# Patient Record
Sex: Female | Born: 1945 | Smoking: Never smoker
Health system: Southern US, Community
[De-identification: ages and names within clinical notes are randomized; demographics above are authoritative.]

## PROBLEM LIST (undated history)

## (undated) DIAGNOSIS — N183 Chronic kidney disease, stage 3 unspecified: Secondary | ICD-10-CM

## (undated) DIAGNOSIS — I10 Essential (primary) hypertension: Secondary | ICD-10-CM

## (undated) DIAGNOSIS — J309 Allergic rhinitis, unspecified: Secondary | ICD-10-CM

## (undated) HISTORY — PX: PROSTATE SURGERY: SHX751

## (undated) HISTORY — DX: Chronic kidney disease, stage 3 unspecified: N18.30

## (undated) HISTORY — PX: CARPAL TUNNEL RELEASE: SHX101

## (undated) HISTORY — DX: Essential (primary) hypertension: I10

## (undated) HISTORY — PX: TOTAL ABDOMINAL HYSTERECTOMY W/ BILATERAL SALPINGOOPHORECTOMY: SHX83

## (undated) HISTORY — DX: Allergic rhinitis, unspecified: J30.9

---

## 2005-02-25 ENCOUNTER — Ambulatory Visit: Payer: Self-pay | Admitting: Family Medicine

## 2005-03-11 ENCOUNTER — Ambulatory Visit: Payer: Self-pay | Admitting: Family Medicine

## 2008-11-13 ENCOUNTER — Ambulatory Visit: Payer: Self-pay | Admitting: Cardiology

## 2008-11-20 ENCOUNTER — Ambulatory Visit: Payer: Self-pay | Admitting: Cardiology

## 2008-12-15 ENCOUNTER — Ambulatory Visit: Payer: Self-pay | Admitting: Cardiology

## 2011-04-19 NOTE — Assessment & Plan Note (Signed)
[name]                          [facility]ICE NOTE   NAME:[name], [name]                        MRN:          [id number]  DATE:12/15/2008                            DOB:          [birth year]    PRIMARY CARE PHYSICIAN:  Prudy Feeler, PA-C   REASON FOR VISIT:  Followup cardiac testing.   HISTORY OF PRESENT ILLNESS:  [name] [name] was seen back in December with a  history of hypertension and atypical chest pain.  She was referred for  cardiac risk stratification and underwent an exercise echocardiogram,  which did show a hypertensive response, but no reported chest pain at a  maximum workload at 7 mets.  There were no electrocardiographic changes  to suggest ischemia and no echocardiographic wall motion abnormalities  to suggest ischemia.  I reviewed these results with her today and she  was reassured.  We did adjust her medications somewhat changing her to  lisinopril/hydrochlorothiazide 20/12.5 mg p.o. q.a.m.  Followup labs  showed a BUN and creatinine 12 and 1.9 with a potassium of 3.9.  Her  blood pressure based on home checks has been much better controlled.   ALLERGIES:  AUGMENTIN.   PRESENT MEDICATIONS:  1. Omeprazole 20 mg p.o. daily.  2. Lisinopril/hydrochlorothiazide 20/12.5 mg p.o. q.a.m.   REVIEW OF SYSTEMS:  As described in the history of present illness.  Otherwise, negative.   PHYSICAL EXAMINATION:  VITAL SIGNS:  Blood pressure today is 122/80,  heart rate is 77, weight is 183 pounds.  GENERAL:  The patient is comfortable and in no acute distress.  HEENT:  Conjunctivae and lids normal.  Oropharynx is clear.  NECK:  Supple.  No elevated jugular venous pressure, no loud bruits, no  thyromegaly noted.  LUNGS:  Clear without labored breathing at rest.  CARDIAC:  Regular rate and rhythm.  No loud murmur or gallop.  EXTREMITIES:  No pitting edema.   IMPRESSION AND RECOMMENDATIONS:  1. Hypertension, much better controlled on  present dose of      lisinopril/hydrochlorothiazide.  No specific changes were made      today.  She will continue to follow up with Prudy Feeler.  2. Recent reassuring cardiac risk stratification with a normal      exercise echocardiogram showing no clear evidence of ischemia      either by electrocardiographic or echocardiographic      criteria.  Hypertension with exercise was noted.  This will warrant      continued antihypertensive therapy.  No further cardiac testing is      anticipated at this time.     Jonelle Sidle, MD  Electronically Signed    SGM/MedQ  DD: 12/15/2008  DT: 12/16/2008  Job #: [id number]   cc:   Prudy Feeler, PA-C

## 2011-04-22 NOTE — Assessment & Plan Note (Signed)
[facility]                          [facility]ICE NOTE   NAME:[name], [name]                        MRN:          [id number]  DATE:11/23/2008                            DOB:          1946/04/23    REFERRING [name] [name]:  [name] Feeler, PA-C   REASON FOR REFERRAL:  Chest pain.   HISTORY OF PRESENT ILLNESS:  [name] [name] is a 65 year old female patient  with history of newly diagnosed hypertension who is referred to Dr.  Diona [name] for further evaluation of chest pain.  She denies any history  of coronary artery disease, diabetes mellitus, dyslipidemia, or  congestive heart failure.  She, over the last 2-3 months, has noticed  sharp substernal chest pain that can occur at any time.  She notes it at  rest as well as with exertion.  She denies any associated shortness of  breath.  She denies nausea, diaphoresis, syncope, or near syncope.  She  denies any arm or jaw pain.  She denies orthopnea, PND, or pedal edema.  She denies palpitations.  She denies syncope.  She was placed on  lisinopril as well as omeprazole recently by Ms. [name] [name].  She brings in  her blood pressures with her today and also her machine.  Her machine  actually got about 20 points higher than our blood pressure cuff.  Most  of her pressures are in the 140s-150s/90s.  Her symptoms of chest  discomfort have reoccurred on two occasions since starting the  omeprazole.  Laboratory data obtained by [name] [name] in early November  2009 demonstrate a creatinine of 0.9, total cholesterol 200,  triglycerides 127, HDL 77, LDL 98, TSH 2.96, and a hemoglobin of 13.9.   PAST MEDICAL HISTORY:  As outlined above in the history of present  illness.  She also has a history of right carpal tunnel release, as well  as total abdominal hysterectomy, and bilateral salpingo-oophorectomy.  She has had a history of bright red blood per rectum and had a  colonoscopy per her report about 2 years ago with Dr.  Gabriel [name].  She also  has problems with allergic rhinitis.   CURRENT MEDICATIONS:  1. Omeprazole 20 mg daily.  2. Lisinopril 20 mg daily.  3. She takes diclofenac 75 mg b.i.d. p.r.n.  4. Erycin p.r.n.   ALLERGIES:  AUGMENTIN causes facial swelling.   SOCIAL HISTORY:  She is divorced, has 3 children.  She is retired from  [employer] work.  She denies any history of tobacco or alcohol  abuse.   FAMILY HISTORY:  Insignificant for premature CAD.  Her mother died at  age 79 and did suffer from congestive heart failure.  She also had a  pacemaker and a history of COPD.   REVIEW OF SYSTEMS:  Please see HPI.  She denies fevers, chills, cough,  melena, hematuria, or dysuria.  She has had a history of bright red  blood per rectum as noted above.  This is particularly worse with  constipation.  She does have what sounds like significant dysphagia.  This is usually with solid foods.  She denies  significant belching or  water brash symptoms.  Rest of the review of systems are negative.   PHYSICAL EXAMINATION:  GENERAL:  She is a well-nourished, well-developed  female in no acute distress.  VITAL SIGNS:  Blood pressure is 153/107 on the left and 157/103 on the  right, pulse is 88, and weight is 182 pounds.  HEENT:  Normal.  NECK:  Without JVD.  LYMPH:  Without lymphadenopathy.  ENDOCRINE:  Without thyromegaly.  CARDIAC:  Normal S1 and S2.  Regular rate and rhythm without murmur.  LUNGS:  Clear to auscultation bilaterally without wheezing, rhonchi, or  rales.  ABDOMEN:  Soft and nontender with normoactive bowel sounds.  No  organomegaly.  EXTREMITIES:  Without clubbing, cyanosis, or edema.  MUSCULOSKELETAL:  No joint deformity.  NEUROLOGIC:  She is alert and oriented x3.  Cranial nerves II-XII are  grossly intact.  FASCIA:  No carotid bruits noted bilaterally.  No femoral artery bruits  noted bilaterally.   Electrocardiogram reveals sinus rhythm with heart rate of 83, normal   axis, nonspecific ST-T wave changes, and no ischemic changes.   ASSESSMENT AND PLAN:  [name] [name] is a 65 year old female with newly  diagnosed hypertension and recent history of chest pain.  Her chest pain  is atypical.  She has very few cardiac risk factors.  She also has a  history of dysphagia and has recently been placed on omeprazole.  She  was also interviewed and examined by Dr. Diona [name] today.  We have  recommended proceeding with a stress echocardiogram to rule out the  possibility of ischemia contributing to her symptoms.  She needs better  blood pressure control and we have asked her to change her lisinopril to  lisinopril/hydrochlorothiazide 20/12.5 mg daily.  She will have a BMET  in follow up in 1 week after changing her medication.  She has been  asked to follow up with Ms. [name] [name] to further discuss her dysphagia.  Some of her symptoms of dysphagia sound quite significant and she likely  requires further workup.  She will be brought back in followup with Dr.  Diona [name] in the next month to discuss the findings on her stress  echocardiogram.     [name] Newcomer, PA-C  Electronically Signed      [name] Sidle, MD  Electronically Signed   SW/MedQ  DD: 11/13/2008  DT: 11/14/2008  Job #: [id number]

## 2012-09-25 ENCOUNTER — Encounter: Payer: Self-pay | Admitting: *Deleted

## 2012-09-27 ENCOUNTER — Other Ambulatory Visit: Payer: Self-pay | Admitting: Cardiovascular Disease

## 2012-09-27 ENCOUNTER — Encounter: Payer: Self-pay | Admitting: *Deleted

## 2012-09-27 ENCOUNTER — Encounter: Payer: Self-pay | Admitting: Cardiovascular Disease

## 2012-09-27 ENCOUNTER — Telehealth: Payer: Self-pay

## 2012-09-27 ENCOUNTER — Ambulatory Visit (INDEPENDENT_AMBULATORY_CARE_PROVIDER_SITE_OTHER): Payer: Medicare Other | Admitting: Cardiovascular Disease

## 2012-09-27 VITALS — BP 140/85 | HR 81 | Ht 63.0 in | Wt 181.8 lb

## 2012-09-27 DIAGNOSIS — Z136 Encounter for screening for cardiovascular disorders: Secondary | ICD-10-CM

## 2012-09-27 DIAGNOSIS — I10 Essential (primary) hypertension: Secondary | ICD-10-CM

## 2012-09-27 DIAGNOSIS — R079 Chest pain, unspecified: Secondary | ICD-10-CM

## 2012-09-27 NOTE — Telephone Encounter (Signed)
stress echocardiogram  Monday, October 01, 2012 [facility]

## 2012-09-27 NOTE — Patient Instructions (Addendum)
Your physician has requested that you have a stress echocardiogram. For further information please visit [email or web address]. Please follow instruction sheet as given.  Follow up as needed.  

## 2012-09-28 ENCOUNTER — Encounter: Payer: Self-pay | Admitting: Cardiovascular Disease

## 2012-09-28 DIAGNOSIS — R079 Chest pain, unspecified: Secondary | ICD-10-CM | POA: Insufficient documentation

## 2012-09-28 DIAGNOSIS — I10 Essential (primary) hypertension: Secondary | ICD-10-CM | POA: Insufficient documentation

## 2012-09-28 NOTE — Telephone Encounter (Signed)
[employer] ZOXW#[id number] EXP 10-27-12

## 2012-09-28 NOTE — Assessment & Plan Note (Signed)
The patient's chest pain is overall atypical and she has prolonged history of hypertension and other risk factors. ECG is slightly abnormal with or R wave progression in the anterior leads. I recommend further ischemic evaluation with a treadmill echocardiogram. I discussed with her the importance of lifestyle changes controlling risk factors.

## 2012-09-28 NOTE — Progress Notes (Signed)
HPI  This is a 66 year old pleasant female who is here today for evaluation of chest pain. She was seen in January of 2010 by Dr. Diona Browner for atypical chest pain. She had a stress echocardiogram at that time which was normal. She has known history of hypertension but no previous cardiac history. She has no history of diabetes, hyperlipidemia or tobacco use. There is no family history of premature coronary artery disease. Her mother died from congestive heart failure but she was 66 years old. The patient has been having left-sided sharp chest discomfort radiating to her left arm mostly at rest. There is no tightness feeling. She is very active and able to do all her yard work. One time she had exertional discomfort but was brief. Few weeks ago while she was at a funeral, she had dizziness that lasted for about 5 minutes with no palpitations or syncope.  Allergies  Allergen Reactions  . Augmentin (Amoxicillin-Pot Clavulanate) Other (See Comments)    Facial swelling     Current Outpatient Prescriptions on File Prior to Visit  Medication Sig Dispense Refill  . losartan-hydrochlorothiazide (HYZAAR) 50-12.5 MG per tablet Take 1 tablet by mouth daily.       Marland Kitchen omeprazole (PRILOSEC) 20 MG capsule Take 20 mg by mouth daily.         Past Medical History  Diagnosis Date  . Allergic rhinitis   . HTN (hypertension)      Past Surgical History  Procedure Date  . Carpal tunnel release     RIGHT  . Total abdominal hysterectomy w/ bilateral salpingoophorectomy      Family History  Problem Relation Age of Onset  . Heart failure Mother     had pacemaker  . COPD Mother      History   Social History  . Marital Status: Married    Spouse Name: N/A    Number of Children: N/A  . Years of Education: N/A   Occupational History  .      retired from [employer] work   Social History Main Topics  . Smoking status: Never Smoker   . Smokeless tobacco: Never Used  . Alcohol Use: No    . Drug Use: No  . Sexually Active: Not on file   Other Topics Concern  . Not on file   Social History Narrative   Has 3 children     ROS Constitutional: Negative for fever, chills, diaphoresis, activity change, appetite change and fatigue.  HENT: Negative for hearing loss, nosebleeds, congestion, sore throat, facial swelling, drooling, trouble swallowing, neck pain, voice change, sinus pressure and tinnitus.  Eyes: Negative for photophobia, pain, discharge and visual disturbance.  Respiratory: Negative for apnea, cough  and wheezing.  Cardiovascular: Negative for chest pain, palpitations and leg swelling.  Gastrointestinal: Negative for nausea, vomiting, abdominal pain, diarrhea, constipation, blood in stool and abdominal distention.  Genitourinary: Negative for dysuria, urgency, frequency, hematuria and decreased urine volume.  Musculoskeletal: Negative for myalgias, back pain, joint swelling, arthralgias and gait problem.  Skin: Negative for color change, pallor, rash and wound.  Neurological: Negative for dizziness, tremors, seizures, syncope, speech difficulty, weakness, light-headedness, numbness and headaches.  Psychiatric/Behavioral: Negative for suicidal ideas, hallucinations, behavioral problems and agitation. The patient is not nervous/anxious.     PHYSICAL EXAM   BP 140/85  Pulse 81  Ht 5\' 3"  (1.6 m)  Wt 181 lb 12.8 oz (82.464 kg)  BMI 32.20 kg/m2 Constitutional: She is oriented to person, place, and time.  She appears well-developed and well-nourished. No distress.  HENT: No nasal discharge.  Head: Normocephalic and atraumatic.  Eyes: Pupils are equal and round. Right eye exhibits no discharge. Left eye exhibits no discharge.  Neck: Normal range of motion. Neck supple. No JVD present. No thyromegaly present.  Cardiovascular: Normal rate, regular rhythm, normal heart sounds. Exam reveals no gallop and no friction rub. No murmur heard.  Pulmonary/Chest: Effort  normal and breath sounds normal. No stridor. No respiratory distress. She has no wheezes. She has no rales. She exhibits no tenderness.  Abdominal: Soft. Bowel sounds are normal. She exhibits no distension. There is no tenderness. There is no rebound and no guarding.  Musculoskeletal: Normal range of motion. She exhibits no edema and no tenderness.  Neurological: She is alert and oriented to person, place, and time. Coordination normal.  Skin: Skin is warm and dry. No rash noted. She is not diaphoretic. No erythema. No pallor.  Psychiatric: She has a normal mood and affect. Her behavior is normal. Judgment and thought content normal.     EKG: Normal sinus rhythm with poor R-wave progression anterior leads. No significant ST or T wave changes.    ASSESSMENT AND PLAN

## 2012-09-28 NOTE — Assessment & Plan Note (Signed)
Her blood pressure is slightly elevated today. This will be evaluated during the stress test as well and consider increasing the dose of Hyzaar if needed

## 2012-10-01 DIAGNOSIS — R079 Chest pain, unspecified: Secondary | ICD-10-CM

## 2012-10-05 ENCOUNTER — Encounter: Payer: Self-pay | Admitting: *Deleted

## 2012-10-30 ENCOUNTER — Ambulatory Visit: Payer: Self-pay | Admitting: Cardiology

## 2015-11-19 ENCOUNTER — Encounter: Payer: Self-pay | Admitting: Physician Assistant

## 2016-01-19 DIAGNOSIS — N644 Mastodynia: Secondary | ICD-10-CM | POA: Diagnosis not present

## 2016-03-28 ENCOUNTER — Encounter: Payer: Self-pay | Admitting: Physician Assistant

## 2016-03-28 DIAGNOSIS — R0789 Other chest pain: Secondary | ICD-10-CM | POA: Diagnosis not present

## 2016-03-31 ENCOUNTER — Encounter: Payer: Self-pay | Admitting: Physician Assistant

## 2016-03-31 DIAGNOSIS — Z01812 Encounter for preprocedural laboratory examination: Secondary | ICD-10-CM | POA: Diagnosis not present

## 2016-08-04 DIAGNOSIS — M940 Chondrocostal junction syndrome [Tietze]: Secondary | ICD-10-CM | POA: Diagnosis not present

## 2016-12-06 DIAGNOSIS — Z1231 Encounter for screening mammogram for malignant neoplasm of breast: Secondary | ICD-10-CM | POA: Diagnosis not present

## 2017-01-02 ENCOUNTER — Telehealth: Payer: Self-pay | Admitting: Physician Assistant

## 2017-01-02 DIAGNOSIS — M2550 Pain in unspecified joint: Secondary | ICD-10-CM

## 2017-01-02 MED ORDER — DICLOFENAC SODIUM 75 MG PO TBEC: 75.0000 mg | DELAYED_RELEASE_TABLET | Freq: Two times a day (BID) | ORAL | 2 refills | Status: DC

## 2017-01-02 NOTE — Telephone Encounter (Signed)
Patient aware that rx sent to pharmacy. 

## 2017-01-02 NOTE — Telephone Encounter (Signed)
Prescription sent to pharmacy.

## 2017-08-11 ENCOUNTER — Encounter: Payer: Self-pay | Admitting: Physician Assistant

## 2017-08-11 ENCOUNTER — Ambulatory Visit (INDEPENDENT_AMBULATORY_CARE_PROVIDER_SITE_OTHER): Payer: Medicare Other | Admitting: Physician Assistant

## 2017-08-11 VITALS — BP 133/87 | HR 77 | Temp 97.9°F | Ht 63.0 in | Wt 183.0 lb

## 2017-08-11 DIAGNOSIS — I10 Essential (primary) hypertension: Secondary | ICD-10-CM | POA: Diagnosis not present

## 2017-08-11 DIAGNOSIS — R51 Headache: Secondary | ICD-10-CM

## 2017-08-11 DIAGNOSIS — R21 Rash and other nonspecific skin eruption: Secondary | ICD-10-CM | POA: Diagnosis not present

## 2017-08-11 DIAGNOSIS — R5383 Other fatigue: Secondary | ICD-10-CM | POA: Diagnosis not present

## 2017-08-11 DIAGNOSIS — M159 Polyosteoarthritis, unspecified: Secondary | ICD-10-CM | POA: Diagnosis not present

## 2017-08-11 DIAGNOSIS — R519 Headache, unspecified: Secondary | ICD-10-CM

## 2017-08-11 DIAGNOSIS — M2550 Pain in unspecified joint: Secondary | ICD-10-CM

## 2017-08-11 DIAGNOSIS — W57XXXS Bitten or stung by nonvenomous insect and other nonvenomous arthropods, sequela: Secondary | ICD-10-CM

## 2017-08-11 MED ORDER — PANTOPRAZOLE SODIUM 40 MG PO TBEC: 40.0000 mg | DELAYED_RELEASE_TABLET | Freq: Every day | ORAL | 3 refills | Status: DC

## 2017-08-11 MED ORDER — DICLOFENAC SODIUM 75 MG PO TBEC: 75.0000 mg | DELAYED_RELEASE_TABLET | Freq: Two times a day (BID) | ORAL | 3 refills | Status: DC

## 2017-08-11 NOTE — Patient Instructions (Signed)
Lyme Disease Lyme disease is an infection that affects many parts of the body, including the skin, joints, and nervous system. It is a bacterial infection that starts from the bite of an infected tick. The infection can spread, and some of the symptoms are similar to the flu. If Lyme disease is not treated, it may cause joint pain, swelling, numbness, problems thinking, fatigue, muscle weakness, and other problems. What are the causes? This condition is caused by bacteria called Borrelia burgdorferi. You can get Lyme disease by being bitten by an infected tick. The tick must be attached to your skin to pass along the infection. Deer often carry infected ticks. What increases the risk? The following factors may make you more likely to develop this condition:  Living in or visiting these areas in the [place]  [place].  The [place] states.  The [place].  Spending time in wooded or grassy areas.  Being outdoors with exposed skin.  Camping, gardening, hiking, fishing, or hunting outdoors.  Failing to remove a tick from your skin within 3-4 days. What are the signs or symptoms? Symptoms of this condition include:  A round, red rash that surrounds the center of the tick bite. This is the first sign of infection. The center of the rash may be blood colored or have tiny blisters.  Fatigue.  Headache.  Chills and fever.  General achiness.  Joint pain, often in the knees.  Muscle pain.  Swollen lymph glands.  Stiff neck. How is this diagnosed? This condition is diagnosed based on:  Your symptoms and medical history.  A physical exam.  A blood test. How is this treated? The main treatment for this condition is antibiotic medicine, which is usually taken by mouth (orally). The length of treatment depends on how soon after a tick bite you begin taking the medicine. In some cases, treatment is necessary for several weeks. If the infection is severe, antibiotics may  need to be given through an IV tube that is inserted into one of your veins. Follow these instructions at home:  Take your antibiotic medicine as told by your health care provider. Do not stop taking the antibiotic even if you start to feel better.  Ask your health care provider about takinga probiotic in between doses of your antibiotic to help avoid stomach upset or diarrhea.  Check with your health care provider before supplementing your treatment. Many alternative therapies have not been proven and may be harmful to you.  Keep all follow-up visits as told by your health care provider. This is important. How is this prevented? You can become reinfected if you get another tick bite from an infected tick. Take these steps to help prevent an infection:  Cover your skin with light-colored clothing when you are outdoors in the spring and summer months.  Spray clothing and skin with bug spray. The spray should be 20-30% DEET.  Avoid wooded, grassy, and shaded areas.  Remove yard litter, brush, trash, and plants that attract deer and rodents.  Check yourself for ticks when you come indoors.  Wash clothing worn each day.  Check your pets for ticks before they come inside.  If you find a tick:  Remove it with tweezers.  Clean your hands and the bite area with rubbing alcohol or soap and water. Pregnant women should take special care to avoid tick bites because the infection can be passed along to the fetus. Contact a health care provider if:  You have symptoms after   treatment.  You have removed a tick and want to bring it to your health care provider for testing. Get help right away if:  You have an irregular heartbeat.  You have nerve pain.  Your face feels numb. This information is not intended to replace advice given to you by your health care provider. Make sure you discuss any questions you have with your health care provider. Document Released: 02/27/2001 Document  Revised: 07/12/2016 Document Reviewed: 07/12/2016 Elsevier Interactive Patient Education  2017 [employer]  

## 2017-08-14 LAB — CBC WITH DIFFERENTIAL/PLATELET
BASOS ABS: 0 10*3/uL (ref 0.0–0.2)
Basos: 0 %
EOS (ABSOLUTE): 0.1 10*3/uL (ref 0.0–0.4)
Eos: 1 %
Hematocrit: 40.3 % (ref 34.0–46.6)
Hemoglobin: 12.7 g/dL (ref 11.1–15.9)
Immature Grans (Abs): 0 10*3/uL (ref 0.0–0.1)
Immature Granulocytes: 0 %
LYMPHS ABS: 2.4 10*3/uL (ref 0.7–3.1)
Lymphs: 25 %
MCH: 26.3 pg — ABNORMAL LOW (ref 26.6–33.0)
MCHC: 31.5 g/dL (ref 31.5–35.7)
MCV: 83 fL (ref 79–97)
MONOCYTES: 8 %
MONOS ABS: 0.7 10*3/uL (ref 0.1–0.9)
Neutrophils Absolute: 6.2 10*3/uL (ref 1.4–7.0)
Neutrophils: 66 %
PLATELETS: 375 10*3/uL (ref 150–379)
RBC: 4.83 x10E6/uL (ref 3.77–5.28)
RDW: 14.2 % (ref 12.3–15.4)
WBC: 9.4 10*3/uL (ref 3.4–10.8)

## 2017-08-14 LAB — LIPID PANEL
Chol/HDL Ratio: 2.7 ratio (ref 0.0–4.4)
Cholesterol, Total: 194 mg/dL (ref 100–199)
HDL: 73 mg/dL (ref >39)
LDL Calculated: 100 mg/dL — ABNORMAL HIGH (ref 0–99)
TRIGLYCERIDES: 105 mg/dL (ref 0–149)
VLDL Cholesterol Cal: 21 mg/dL (ref 5–40)

## 2017-08-14 LAB — CMP14+EGFR
ALK PHOS: 88 IU/L (ref 39–117)
ALT: 17 IU/L (ref 0–32)
AST: 19 IU/L (ref 0–40)
Albumin/Globulin Ratio: 1.4 (ref 1.2–2.2)
Albumin: 4.4 g/dL (ref 3.5–4.8)
BUN/Creatinine Ratio: 19 (ref 12–28)
BUN: 20 mg/dL (ref 8–27)
Bilirubin Total: 0.3 mg/dL (ref 0.0–1.2)
CO2: 26 mmol/L (ref 20–29)
CREATININE: 1.06 mg/dL — AB (ref 0.57–1.00)
Calcium: 10.3 mg/dL (ref 8.7–10.3)
Chloride: 99 mmol/L (ref 96–106)
GFR calc Af Amer: 61 mL/min/{1.73_m2} (ref >59)
GFR calc non Af Amer: 53 mL/min/{1.73_m2} — ABNORMAL LOW (ref >59)
GLUCOSE: 96 mg/dL (ref 65–99)
Globulin, Total: 3.1 g/dL (ref 1.5–4.5)
Potassium: 4.3 mmol/L (ref 3.5–5.2)
Sodium: 142 mmol/L (ref 134–144)
Total Protein: 7.5 g/dL (ref 6.0–8.5)

## 2017-08-14 LAB — LYME AB/WESTERN BLOT REFLEX

## 2017-08-14 LAB — THYROID PANEL WITH TSH
FREE THYROXINE INDEX: 2.3 (ref 1.2–4.9)
T3 UPTAKE RATIO: 25 % (ref 24–39)
T4, Total: 9.1 ug/dL (ref 4.5–12.0)
TSH: 4 u[IU]/mL (ref 0.450–4.500)

## 2017-08-14 NOTE — Progress Notes (Signed)
BP 133/87   Pulse 77   Temp 97.9 F (36.6 C) (Oral)   Ht 5' 3" (1.6 m)   Wt 183 lb (83 kg)   BMI 32.42 kg/m    Subjective:    Patient ID: [name] [name], female    DOB: 11/22/1946, 71 y.o.   MRN: [id number]  HPI: [name] [name] is a 71 y.o. female presenting on 08/11/2017 for Hypertension (medication refill, labwork); Gastroesophageal Reflux; and New Patient (Initial Visit)  She has been well overall until mid summer. She had known exposure to deer tick bites. Reports many symptoms of feeling feverish, tired, myalgias in large muscles, later joint pain.  She is concerned about tick borne disease.  Her GERD has been stable, along with OA and HTN.  Relevant past medical, surgical, family and social history reviewed and updated as indicated. Allergies and medications reviewed and updated.  Past Medical History:  Diagnosis Date  . Allergic rhinitis   . HTN (hypertension)     Past Surgical History:  Procedure Laterality Date  . CARPAL TUNNEL RELEASE     RIGHT  . TOTAL ABDOMINAL HYSTERECTOMY W/ BILATERAL SALPINGOOPHORECTOMY      Review of Systems  Constitutional: Positive for fatigue. Negative for activity change and fever.  HENT: Negative.   Eyes: Negative.   Respiratory: Negative.  Negative for cough.   Cardiovascular: Negative.  Negative for chest pain.  Gastrointestinal: Negative.  Negative for abdominal pain.  Endocrine: Negative.   Genitourinary: Negative.  Negative for dysuria.  Musculoskeletal: Positive for arthralgias and myalgias.  Skin: Negative.   Neurological: Positive for headaches. Negative for numbness.    Allergies as of 08/11/2017      Reactions   Augmentin [amoxicillin-pot Clavulanate] Other (See Comments)   Facial swelling      Medication List       Accurate as of 08/11/17 11:59 PM. Always use your most recent med list.          diclofenac 75 MG EC tablet Commonly known as:  VOLTAREN Take 1 tablet (75 mg total) by mouth 2 (two) times daily.     losartan-hydrochlorothiazide 50-12.5 MG tablet Commonly known as:  HYZAAR Take 1 tablet by mouth daily.   pantoprazole 40 MG tablet Commonly known as:  PROTONIX Take 1 tablet (40 mg total) by mouth daily.            Discharge Care Instructions        Start     Ordered   08/11/17 0000  diclofenac (VOLTAREN) 75 MG EC tablet  2 times daily     08/11/17 1124   08/11/17 0000  pantoprazole (PROTONIX) 40 MG tablet  Daily     08/11/17 1124   08/11/17 0000  CBC with Differential/Platelet     08/11/17 1124   08/11/17 0000  CMP14+EGFR     08/11/17 1124   08/11/17 0000  Lyme Ab/Western Blot Reflex     08/11/17 1124   08/11/17 0000  Lipid panel     08/11/17 1124   08/11/17 0000  Thyroid Panel With TSH     08/11/17 1124         Objective:    BP 133/87   Pulse 77   Temp 97.9 F (36.6 C) (Oral)   Ht 5' 3" (1.6 m)   Wt 183 lb (83 kg)   BMI 32.42 kg/m   Allergies  Allergen Reactions  . Augmentin [Amoxicillin-Pot Clavulanate] Other (See Comments)    Facial swelling  Physical Exam  Constitutional: She is oriented to person, place, and time. She appears well-developed and well-nourished.  HENT:  Head: Normocephalic and atraumatic.  Right Ear: Tympanic membrane, external ear and ear canal normal.  Left Ear: Tympanic membrane, external ear and ear canal normal.  Nose: Nose normal. No rhinorrhea.  Mouth/Throat: Oropharynx is clear and moist and mucous membranes are normal. No oropharyngeal exudate or posterior oropharyngeal erythema.  Eyes: Pupils are equal, round, and reactive to light. Conjunctivae and EOM are normal.  Neck: Normal range of motion. Neck supple.  Cardiovascular: Normal rate, regular rhythm, normal heart sounds and intact distal pulses.   Pulmonary/Chest: Effort normal and breath sounds normal.  Abdominal: Soft. Bowel sounds are normal.  Neurological: She is alert and oriented to person, place, and time. She has normal reflexes.  Skin: Skin is warm and  dry. No rash noted.  Psychiatric: She has a normal mood and affect. Her behavior is normal. Judgment and thought content normal.    Results for orders placed or performed in visit on 08/11/17  CBC with Differential/Platelet  Result Value Ref Range   WBC 9.4 3.4 - 10.8 x10E3/uL   RBC 4.83 3.77 - 5.28 x10E6/uL   Hemoglobin 12.7 11.1 - 15.9 g/dL   Hematocrit 40.3 34.0 - 46.6 %   MCV 83 79 - 97 fL   MCH 26.3 (L) 26.6 - 33.0 pg   MCHC 31.5 31.5 - 35.7 g/dL   RDW 14.2 12.3 - 15.4 %   Platelets 375 150 - 379 x10E3/uL   Neutrophils 66 Not Estab. %   Lymphs 25 Not Estab. %   Monocytes 8 Not Estab. %   Eos 1 Not Estab. %   Basos 0 Not Estab. %   Neutrophils Absolute 6.2 1.4 - 7.0 x10E3/uL   Lymphocytes Absolute 2.4 0.7 - 3.1 x10E3/uL   Monocytes Absolute 0.7 0.1 - 0.9 x10E3/uL   EOS (ABSOLUTE) 0.1 0.0 - 0.4 x10E3/uL   Basophils Absolute 0.0 0.0 - 0.2 x10E3/uL   Immature Granulocytes 0 Not Estab. %   Immature Grans (Abs) 0.0 0.0 - 0.1 x10E3/uL  CMP14+EGFR  Result Value Ref Range   Glucose 96 65 - 99 mg/dL   BUN 20 8 - 27 mg/dL   Creatinine, Ser 1.06 (H) 0.57 - 1.00 mg/dL   GFR calc non Af Amer 53 (L) >59 mL/min/1.73   GFR calc Af Amer 61 >59 mL/min/1.73   BUN/Creatinine Ratio 19 12 - 28   Sodium 142 134 - 144 mmol/L   Potassium 4.3 3.5 - 5.2 mmol/L   Chloride 99 96 - 106 mmol/L   CO2 26 20 - 29 mmol/L   Calcium 10.3 8.7 - 10.3 mg/dL   Total Protein 7.5 6.0 - 8.5 g/dL   Albumin 4.4 3.5 - 4.8 g/dL   Globulin, Total 3.1 1.5 - 4.5 g/dL   Albumin/Globulin Ratio 1.4 1.2 - 2.2   Bilirubin Total 0.3 0.0 - 1.2 mg/dL   Alkaline Phosphatase 88 39 - 117 IU/L   AST 19 0 - 40 IU/L   ALT 17 0 - 32 IU/L  Lyme Ab/Western Blot Reflex  Result Value Ref Range   Lyme IgG/IgM Ab <0.91 0.00 - 0.90 ISR   LYME DISEASE AB, QUANT, IGM <0.80 0.00 - 0.79 index  Lipid panel  Result Value Ref Range   Cholesterol, Total 194 100 - 199 mg/dL   Triglycerides 105 0 - 149 mg/dL   HDL 73 >39 mg/dL   VLDL  Cholesterol Cal 21  5 - 40 mg/dL   LDL Calculated 100 (H) 0 - 99 mg/dL   Chol/HDL Ratio 2.7 0.0 - 4.4 ratio  Thyroid Panel With TSH  Result Value Ref Range   TSH 4.000 0.450 - 4.500 uIU/mL   T4, Total 9.1 4.5 - 12.0 ug/dL   T3 Uptake Ratio 25 24 - 39 %   Free Thyroxine Index 2.3 1.2 - 4.9      Assessment & Plan:   1. Arthralgia, unspecified joint - diclofenac (VOLTAREN) 75 MG EC tablet; Take 1 tablet (75 mg total) by mouth 2 (two) times daily.  Dispense: 180 tablet; Refill: 3 - Lyme Ab/Western Blot Reflex  2. Tick bite, sequela - Lyme Ab/Western Blot Reflex  3. Chronic nonintractable headache, unspecified headache type - CBC with Differential/Platelet - Lyme Ab/Western Blot Reflex  4. Rash - Lyme Ab/Western Blot Reflex  5. Essential hypertension - CBC with Differential/Platelet - CMP14+EGFR - Lipid panel  6. Generalized OA  7. Fatigue, unspecified type - CBC with Differential/Platelet - Lyme Ab/Western Blot Reflex - Thyroid Panel With TSH    Current Outpatient Prescriptions:  .  losartan-hydrochlorothiazide (HYZAAR) 50-12.5 MG per tablet, Take 1 tablet by mouth daily. , Disp: , Rfl:  .  pantoprazole (PROTONIX) 40 MG tablet, Take 1 tablet (40 mg total) by mouth daily., Disp: 90 tablet, Rfl: 3 .  diclofenac (VOLTAREN) 75 MG EC tablet, Take 1 tablet (75 mg total) by mouth 2 (two) times daily., Disp: 180 tablet, Rfl: 3 Continue all other maintenance medications as listed above.  Follow up plan: Return in about 4 weeks (around 09/08/2017) for recheck.  Educational handout given for [place] PA-C [place] [address]  [place], [place] [address] [phone]   08/14/2017, 10:16 PM

## 2017-08-15 ENCOUNTER — Telehealth: Payer: Self-pay | Admitting: Physician Assistant

## 2017-08-15 MED ORDER — DOXYCYCLINE HYCLATE 100 MG PO TABS: 100.0000 mg | ORAL_TABLET | Freq: Two times a day (BID) | ORAL | 0 refills | Status: DC

## 2017-08-15 NOTE — Telephone Encounter (Signed)
Patient aware that antibiotic has been sent to pharmacy

## 2017-09-06 ENCOUNTER — Telehealth: Payer: Self-pay | Admitting: Physician Assistant

## 2017-09-06 MED ORDER — DOXYCYCLINE HYCLATE 100 MG PO TABS: 100.0000 mg | ORAL_TABLET | Freq: Two times a day (BID) | ORAL | 0 refills | Status: DC

## 2017-09-06 NOTE — Telephone Encounter (Signed)
What symptoms do you have? Blister near tick bite  How long have you been sick? Tick bite happened last week  Have you been seen for this problem? Yes in the past was taking antibiotic will need a refill since she got bit again  If your provider decides to give you a prescription, which pharmacy would you like for it to be sent to? [facility]   Patient informed that this information will be sent to the clinical staff for review and that they should receive a follow up call.

## 2017-09-06 NOTE — Telephone Encounter (Signed)
Patient aware.

## 2017-09-06 NOTE — Telephone Encounter (Signed)
Please advise if refill will be sent to pharmacy.

## 2017-09-11 ENCOUNTER — Ambulatory Visit: Payer: Medicare Other | Admitting: Physician Assistant

## 2017-09-15 ENCOUNTER — Ambulatory Visit (INDEPENDENT_AMBULATORY_CARE_PROVIDER_SITE_OTHER): Payer: Medicare Other | Admitting: Physician Assistant

## 2017-09-15 ENCOUNTER — Encounter: Payer: Self-pay | Admitting: Physician Assistant

## 2017-09-15 VITALS — BP 130/85 | HR 81 | Temp 97.6°F | Ht 63.0 in | Wt 183.4 lb

## 2017-09-15 DIAGNOSIS — T7849XA Other allergy, initial encounter: Secondary | ICD-10-CM | POA: Diagnosis not present

## 2017-09-15 DIAGNOSIS — T7840XA Allergy, unspecified, initial encounter: Secondary | ICD-10-CM

## 2017-09-15 MED ORDER — LORATADINE 10 MG PO TABS: 10.0000 mg | ORAL_TABLET | Freq: Every day | ORAL | 0 refills | Status: AC

## 2017-09-15 MED ORDER — PREDNISONE 10 MG (21) PO TBPK: ORAL_TABLET | ORAL | 0 refills | Status: DC

## 2017-09-15 NOTE — Progress Notes (Signed)
BP 130/85   Pulse 81   Temp 97.6 F (36.4 C) (Oral)   Ht 5\' 3"  (1.6 m)   Wt 183 lb 6.4 oz (83.2 kg)   BMI 32.49 kg/m    Subjective:    Patient ID: [name] [name], female    DOB: 1945/12/09, 71 y.o.   MRN: [id number]  HPI: Chellie Vanlue is a 71 y.o. female presenting on 09/15/2017 for Insect Bite (right lower leg)  The patient came in for a new insect bite that has not resolved. It had had some infection. But the pus is now gone and she had finished her antibiotic. However continues to have a swollen blistery area at the site of the bite. She denies any fever or chills.  Relevant past medical, surgical, family and social history reviewed and updated as indicated. Allergies and medications reviewed and updated.  Past Medical History:  Diagnosis Date  . Allergic rhinitis   . HTN (hypertension)     Past Surgical History:  Procedure Laterality Date  . CARPAL TUNNEL RELEASE     RIGHT  . PROSTATE SURGERY    . TOTAL ABDOMINAL HYSTERECTOMY W/ BILATERAL SALPINGOOPHORECTOMY      Review of Systems  Constitutional: Negative.  Negative for fatigue and fever.  HENT: Negative.   Eyes: Negative.   Respiratory: Negative.  Negative for shortness of breath and wheezing.   Cardiovascular: Negative for chest pain, palpitations and leg swelling.  Gastrointestinal: Negative.   Genitourinary: Negative.   Skin: Positive for color change and wound.    Allergies as of 09/15/2017      Reactions   Augmentin [amoxicillin-pot Clavulanate] Other (See Comments)   Facial swelling      Medication List       Accurate as of 09/15/17  3:47 PM. Always use your most recent med list.          diclofenac 75 MG EC tablet Commonly known as:  VOLTAREN Take 1 tablet (75 mg total) by mouth 2 (two) times daily.   doxycycline 100 MG tablet Commonly known as:  VIBRA-TABS Take 1 tablet (100 mg total) by mouth 2 (two) times daily.   loratadine 10 MG tablet Commonly known as:  CLARITIN Take 1 tablet  (10 mg total) by mouth daily.   losartan-hydrochlorothiazide 50-12.5 MG tablet Commonly known as:  HYZAAR Take 1 tablet by mouth daily.   pantoprazole 40 MG tablet Commonly known as:  PROTONIX Take 1 tablet (40 mg total) by mouth daily.   predniSONE 10 MG (21) Tbpk tablet Commonly known as:  STERAPRED UNI-PAK 21 TAB Take as directed 6 days          Objective:    BP 130/85   Pulse 81   Temp 97.6 F (36.4 C) (Oral)   Ht 5\' 3"  (1.6 m)   Wt 183 lb 6.4 oz (83.2 kg)   BMI 32.49 kg/m   Allergies  Allergen Reactions  . Augmentin [Amoxicillin-Pot Clavulanate] Other (See Comments)    Facial swelling    Physical Exam  Constitutional: She is oriented to person, place, and time. She appears well-developed and well-nourished.  HENT:  Head: Normocephalic and atraumatic.  Eyes: Pupils are equal, round, and reactive to light. Conjunctivae and EOM are normal.  Cardiovascular: Normal rate, regular rhythm, normal heart sounds and intact distal pulses.   Pulmonary/Chest: Effort normal and breath sounds normal.  Abdominal: Soft. Bowel sounds are normal.  Neurological: She is alert and oriented to person, place, and time. She has normal  reflexes.  Skin: Skin is warm and dry. Rash noted. Rash is vesicular. There is erythema.     2cm width bullous on posterior right ankle. Minimal red ring at the edge. No surrounding erythema or warmth.  Psychiatric: She has a normal mood and affect. Her behavior is normal. Judgment and thought content normal.        Assessment & Plan:   1. Allergic reaction, initial encounter - loratadine (CLARITIN) 10 MG tablet; Take 1 tablet (10 mg total) by mouth daily.  Dispense: 30 tablet; Refill: 0 - predniSONE (STERAPRED UNI-PAK 21 TAB) 10 MG (21) TBPK tablet; Take as directed 6 days  Dispense: 21 tablet; Refill: 0    Current Outpatient Prescriptions:  .  diclofenac (VOLTAREN) 75 MG EC tablet, Take 1 tablet (75 mg total) by mouth 2 (two) times daily.,  Disp: 180 tablet, Rfl: 3 .  doxycycline (VIBRA-TABS) 100 MG tablet, Take 1 tablet (100 mg total) by mouth 2 (two) times daily., Disp: 20 tablet, Rfl: 0 .  losartan-hydrochlorothiazide (HYZAAR) 50-12.5 MG per tablet, Take 1 tablet by mouth daily. , Disp: , Rfl:  .  pantoprazole (PROTONIX) 40 MG tablet, Take 1 tablet (40 mg total) by mouth daily., Disp: 90 tablet, Rfl: 3 .  loratadine (CLARITIN) 10 MG tablet, Take 1 tablet (10 mg total) by mouth daily., Disp: 30 tablet, Rfl: 0 .  predniSONE (STERAPRED UNI-PAK 21 TAB) 10 MG (21) TBPK tablet, Take as directed 6 days, Disp: 21 tablet, Rfl: 0 Continue all other maintenance medications as listed above.  Follow up plan: Return if symptoms worsen or fail to improve.  Educational handout given for [place] PA-C [place] [address]  [place], [place] [address] [phone]   09/15/2017, 3:47 PM

## 2017-09-15 NOTE — Patient Instructions (Signed)
In a few days you may receive a survey in the mail or online from [employer] regarding your visit with us today. Please take a moment to fill this out. Your feedback is very important to our whole office. It can help us better understand your needs as well as improve your experience and satisfaction. Thank you for taking your time to complete it. We care about you.  Jakki Doughty, PA-C  

## 2017-09-28 DIAGNOSIS — M722 Plantar fascial fibromatosis: Secondary | ICD-10-CM | POA: Diagnosis not present

## 2017-09-28 DIAGNOSIS — M79672 Pain in left foot: Secondary | ICD-10-CM | POA: Diagnosis not present

## 2017-10-17 DIAGNOSIS — H2513 Age-related nuclear cataract, bilateral: Secondary | ICD-10-CM | POA: Diagnosis not present

## 2017-10-19 DIAGNOSIS — M722 Plantar fascial fibromatosis: Secondary | ICD-10-CM | POA: Diagnosis not present

## 2017-10-19 DIAGNOSIS — M79672 Pain in left foot: Secondary | ICD-10-CM | POA: Diagnosis not present

## 2017-11-22 DIAGNOSIS — M722 Plantar fascial fibromatosis: Secondary | ICD-10-CM | POA: Diagnosis not present

## 2017-11-22 DIAGNOSIS — M79672 Pain in left foot: Secondary | ICD-10-CM | POA: Diagnosis not present

## 2018-02-06 ENCOUNTER — Encounter: Payer: Self-pay | Admitting: Physician Assistant

## 2018-02-07 ENCOUNTER — Other Ambulatory Visit: Payer: Self-pay | Admitting: *Deleted

## 2018-02-07 MED ORDER — LOSARTAN POTASSIUM-HCTZ 50-12.5 MG PO TABS: 1.0000 | ORAL_TABLET | Freq: Every day | ORAL | 0 refills | Status: DC

## 2018-02-07 NOTE — Telephone Encounter (Signed)
Authorize 30 days only. Then contact the patient letting them know that they will need an appointment before any further prescriptions can be sent in. 

## 2018-02-07 NOTE — Telephone Encounter (Signed)
Patient aware.

## 2018-05-23 ENCOUNTER — Ambulatory Visit (INDEPENDENT_AMBULATORY_CARE_PROVIDER_SITE_OTHER): Payer: Medicare Other

## 2018-05-23 ENCOUNTER — Ambulatory Visit (INDEPENDENT_AMBULATORY_CARE_PROVIDER_SITE_OTHER): Payer: Medicare Other | Admitting: Physician Assistant

## 2018-05-23 ENCOUNTER — Encounter: Payer: Self-pay | Admitting: Physician Assistant

## 2018-05-23 VITALS — BP 139/82 | HR 70 | Temp 97.9°F | Ht 63.0 in | Wt 186.4 lb

## 2018-05-23 DIAGNOSIS — M8580 Other specified disorders of bone density and structure, unspecified site: Secondary | ICD-10-CM | POA: Diagnosis not present

## 2018-05-23 DIAGNOSIS — M722 Plantar fascial fibromatosis: Secondary | ICD-10-CM | POA: Diagnosis not present

## 2018-05-23 DIAGNOSIS — M2550 Pain in unspecified joint: Secondary | ICD-10-CM | POA: Diagnosis not present

## 2018-05-23 DIAGNOSIS — M8588 Other specified disorders of bone density and structure, other site: Secondary | ICD-10-CM | POA: Diagnosis not present

## 2018-05-23 DIAGNOSIS — Z1211 Encounter for screening for malignant neoplasm of colon: Secondary | ICD-10-CM

## 2018-05-23 DIAGNOSIS — Z0000 Encounter for general adult medical examination without abnormal findings: Secondary | ICD-10-CM

## 2018-05-23 LAB — CMP14+EGFR
ALT: 18 IU/L (ref 0–32)
AST: 21 IU/L (ref 0–40)
Albumin/Globulin Ratio: 1.8 (ref 1.2–2.2)
Albumin: 4.5 g/dL (ref 3.5–4.8)
Alkaline Phosphatase: 65 IU/L (ref 39–117)
BUN/Creatinine Ratio: 17 (ref 12–28)
BUN: 17 mg/dL (ref 8–27)
Bilirubin Total: 0.3 mg/dL (ref 0.0–1.2)
CALCIUM: 10.2 mg/dL (ref 8.7–10.3)
CO2: 24 mmol/L (ref 20–29)
CREATININE: 0.98 mg/dL (ref 0.57–1.00)
Chloride: 104 mmol/L (ref 96–106)
GFR calc Af Amer: 67 mL/min/{1.73_m2} (ref >59)
GFR, EST NON AFRICAN AMERICAN: 58 mL/min/{1.73_m2} — AB (ref >59)
Globulin, Total: 2.5 g/dL (ref 1.5–4.5)
Glucose: 82 mg/dL (ref 65–99)
Potassium: 4.2 mmol/L (ref 3.5–5.2)
Sodium: 144 mmol/L (ref 134–144)
Total Protein: 7 g/dL (ref 6.0–8.5)

## 2018-05-23 LAB — CBC WITH DIFFERENTIAL/PLATELET
Basophils Absolute: 0 10*3/uL (ref 0.0–0.2)
Basos: 0 %
EOS (ABSOLUTE): 0.1 10*3/uL (ref 0.0–0.4)
EOS: 2 %
HEMATOCRIT: 41.2 % (ref 34.0–46.6)
Hemoglobin: 13.3 g/dL (ref 11.1–15.9)
Immature Grans (Abs): 0 10*3/uL (ref 0.0–0.1)
Immature Granulocytes: 0 %
Lymphocytes Absolute: 2.2 10*3/uL (ref 0.7–3.1)
Lymphs: 30 %
MCH: 26.7 pg (ref 26.6–33.0)
MCHC: 32.3 g/dL (ref 31.5–35.7)
MCV: 83 fL (ref 79–97)
MONOS ABS: 0.5 10*3/uL (ref 0.1–0.9)
Monocytes: 7 %
Neutrophils Absolute: 4.7 10*3/uL (ref 1.4–7.0)
Neutrophils: 61 %
PLATELETS: 323 10*3/uL (ref 150–450)
RBC: 4.99 x10E6/uL (ref 3.77–5.28)
RDW: 14.5 % (ref 12.3–15.4)
WBC: 7.6 10*3/uL (ref 3.4–10.8)

## 2018-05-23 LAB — LIPID PANEL
CHOL/HDL RATIO: 2.4 ratio (ref 0.0–4.4)
Cholesterol, Total: 205 mg/dL — ABNORMAL HIGH (ref 100–199)
HDL: 84 mg/dL (ref >39)
LDL CALC: 100 mg/dL — AB (ref 0–99)
TRIGLYCERIDES: 107 mg/dL (ref 0–149)
VLDL Cholesterol Cal: 21 mg/dL (ref 5–40)

## 2018-05-23 MED ORDER — LOSARTAN POTASSIUM-HCTZ 50-12.5 MG PO TABS: 1.0000 | ORAL_TABLET | Freq: Every day | ORAL | 3 refills | Status: DC

## 2018-05-23 MED ORDER — DICLOFENAC SODIUM 75 MG PO TBEC: 75.0000 mg | DELAYED_RELEASE_TABLET | Freq: Two times a day (BID) | ORAL | 3 refills | Status: DC

## 2018-05-23 MED ORDER — PANTOPRAZOLE SODIUM 40 MG PO TBEC: 40.0000 mg | DELAYED_RELEASE_TABLET | Freq: Every day | ORAL | 3 refills | Status: DC

## 2018-05-23 NOTE — Progress Notes (Signed)
BP 139/82   Pulse 70   Temp 97.9 F (36.6 C) (Oral)   Ht 5' 3"  (1.6 m)   Wt 186 lb 6.4 oz (84.6 kg)   BMI 33.02 kg/m    Subjective:    Patient ID: [name] [name], female    DOB: 1946/10/18, 72 y.o.   MRN: [id number]  HPI: [name] [name] is a 72 y.o. female presenting on 05/23/2018 for Hypertension  This patient is coming in for 71-monthrecheck on her chronic medical conditions.  They include hypertension, osteopenia, arthritis, she does need a screening for colon cancer.  She also was diagnosed with plantar fasciitis and is been seeing podiatry.  They had her take diclofenac also.  She is trying to wears good shoes as possible.  She reports no other issues at this time.   Past Medical History:  Diagnosis Date  . Allergic rhinitis   . HTN (hypertension)    Relevant past medical, surgical, family and social history reviewed and updated as indicated. Interim medical history since our last visit reviewed. Allergies and medications reviewed and updated. DATA REVIEWED: CHART IN EPIC  Family History reviewed for pertinent findings.  Review of Systems  Allergies as of 05/23/2018      Reactions   Augmentin [amoxicillin-pot Clavulanate] Other (See Comments)   Facial swelling      Medication List        Accurate as of 05/23/18 11:59 PM. Always use your most recent med list.          diclofenac 75 MG EC tablet Commonly known as:  VOLTAREN Take 1 tablet (75 mg total) by mouth 2 (two) times daily.   loratadine 10 MG tablet Commonly known as:  CLARITIN Take 1 tablet (10 mg total) by mouth daily.   losartan-hydrochlorothiazide 50-12.5 MG tablet Commonly known as:  HYZAAR Take 1 tablet by mouth daily.   pantoprazole 40 MG tablet Commonly known as:  PROTONIX Take 1 tablet (40 mg total) by mouth daily.   Vitamin D3 5000 units Caps Take by mouth.          Objective:    BP 139/82   Pulse 70   Temp 97.9 F (36.6 C) (Oral)   Ht 5' 3"  (1.6 m)   Wt 186 lb 6.4 oz (84.6  kg)   BMI 33.02 kg/m   Allergies  Allergen Reactions  . Augmentin [Amoxicillin-Pot Clavulanate] Other (See Comments)    Facial swelling    Wt Readings from Last 3 Encounters:  05/23/18 186 lb 6.4 oz (84.6 kg)  09/15/17 183 lb 6.4 oz (83.2 kg)  08/11/17 183 lb (83 kg)    Physical Exam  Results for orders placed or performed in visit on 05/23/18  CBC with Differential/Platelet  Result Value Ref Range   WBC 7.6 3.4 - 10.8 x10E3/uL   RBC 4.99 3.77 - 5.28 x10E6/uL   Hemoglobin 13.3 11.1 - 15.9 g/dL   Hematocrit 41.2 34.0 - 46.6 %   MCV 83 79 - 97 fL   MCH 26.7 26.6 - 33.0 pg   MCHC 32.3 31.5 - 35.7 g/dL   RDW 14.5 12.3 - 15.4 %   Platelets 323 150 - 450 x10E3/uL   Neutrophils 61 Not Estab. %   Lymphs 30 Not Estab. %   Monocytes 7 Not Estab. %   Eos 2 Not Estab. %   Basos 0 Not Estab. %   Neutrophils Absolute 4.7 1.4 - 7.0 x10E3/uL   Lymphocytes Absolute 2.2 0.7 - 3.1 x10E3/uL  Monocytes Absolute 0.5 0.1 - 0.9 x10E3/uL   EOS (ABSOLUTE) 0.1 0.0 - 0.4 x10E3/uL   Basophils Absolute 0.0 0.0 - 0.2 x10E3/uL   Immature Granulocytes 0 Not Estab. %   Immature Grans (Abs) 0.0 0.0 - 0.1 x10E3/uL  CMP14+EGFR  Result Value Ref Range   Glucose 82 65 - 99 mg/dL   BUN 17 8 - 27 mg/dL   Creatinine, Ser 0.98 0.57 - 1.00 mg/dL   GFR calc non Af Amer 58 (L) >59 mL/min/1.73   GFR calc Af Amer 67 >59 mL/min/1.73   BUN/Creatinine Ratio 17 12 - 28   Sodium 144 134 - 144 mmol/L   Potassium 4.2 3.5 - 5.2 mmol/L   Chloride 104 96 - 106 mmol/L   CO2 24 20 - 29 mmol/L   Calcium 10.2 8.7 - 10.3 mg/dL   Total Protein 7.0 6.0 - 8.5 g/dL   Albumin 4.5 3.5 - 4.8 g/dL   Globulin, Total 2.5 1.5 - 4.5 g/dL   Albumin/Globulin Ratio 1.8 1.2 - 2.2   Bilirubin Total 0.3 0.0 - 1.2 mg/dL   Alkaline Phosphatase 65 39 - 117 IU/L   AST 21 0 - 40 IU/L   ALT 18 0 - 32 IU/L  Lipid panel  Result Value Ref Range   Cholesterol, Total 205 (H) 100 - 199 mg/dL   Triglycerides 107 0 - 149 mg/dL   HDL 84 >39  mg/dL   VLDL Cholesterol Cal 21 5 - 40 mg/dL   LDL Calculated 100 (H) 0 - 99 mg/dL   Chol/HDL Ratio 2.4 0.0 - 4.4 ratio      Assessment & Plan:   1. Plantar fasciitis - diclofenac (VOLTAREN) 75 MG EC tablet; Take 1 tablet (75 mg total) by mouth 2 (two) times daily.  Dispense: 180 tablet; Refill: 3  2. Screening for colon cancer - Cologuard  3. Well adult exam - CBC with Differential/Platelet - CMP14+EGFR - Lipid panel  4. Arthralgia, unspecified joint - diclofenac (VOLTAREN) 75 MG EC tablet; Take 1 tablet (75 mg total) by mouth 2 (two) times daily.  Dispense: 180 tablet; Refill: 3  5. Osteopenia, unspecified location - Cholecalciferol (VITAMIN D3) 5000 units CAPS; Take by mouth. - DG [facility] DEXA   Continue all other maintenance medications as listed above.  Follow up plan: Return in about 1 year (around 05/24/2019) for recheck.  Educational handout given for [place] PA-C [place] [address]  [place],  [address] [phone]   05/24/2018, 2:57 PM

## 2019-02-11 LAB — HM MAMMOGRAPHY

## 2019-04-25 ENCOUNTER — Other Ambulatory Visit: Payer: Self-pay

## 2019-04-25 ENCOUNTER — Ambulatory Visit (INDEPENDENT_AMBULATORY_CARE_PROVIDER_SITE_OTHER): Payer: Medicare Other | Admitting: *Deleted

## 2019-04-25 VITALS — Ht 63.0 in | Wt 186.0 lb

## 2019-04-25 DIAGNOSIS — Z0000 Encounter for general adult medical examination without abnormal findings: Secondary | ICD-10-CM | POA: Diagnosis not present

## 2019-04-25 NOTE — Patient Instructions (Signed)
[name] [name] , Thank you for taking time to come for your Medicare Wellness Visit. I appreciate your ongoing commitment to your health goals. Please review the following plan we discussed and let me know if I can assist you in the future.   These are the goals we discussed: Goals    . Exercise 3x per week (30 min per time)    . Have 3 meals a day       This is a list of the screening recommended for you and due dates:  Health Maintenance  Topic Date Due  .  Hepatitis C: One time screening is recommended by Center for Disease Control  (CDC) for  adults born from [birth year] through 1965.   05-12-1946  . Tetanus Vaccine  03/04/1965  . Colon Cancer Screening  03/04/1996  . Pneumonia vaccines (1 of 2 - PCV13) 03/05/2011  . Flu Shot  07/06/2019  . Mammogram  02/10/2021  . DEXA scan (bone density measurement)  Completed     Advance Directive  Advance directives are legal documents that let you make choices ahead of time about your health care and medical treatment in case you become unable to communicate for yourself. Advance directives are a way for you to communicate your wishes to family, friends, and health care providers. This can help convey your decisions about end-of-life care if you become unable to communicate. Discussing and writing advance directives should happen over time rather than all at once. Advance directives can be changed depending on your situation and what you want, even after you have signed the advance directives. If you do not have an advance directive, some states assign family decision makers to act on your behalf based on how closely you are related to them. Each state has its own laws regarding advance directives. You may want to check with your health care provider, attorney, or state representative about the laws in your state. There are different types of advance directives, such as:  Medical power of attorney.  Living will.  Do not resuscitate (DNR) or do not  attempt resuscitation (DNAR) order. Health care proxy and medical power of attorney A health care proxy, also called a health care agent, is a person who is appointed to make medical decisions for you in cases in which you are unable to make the decisions yourself. Generally, people choose someone they know well and trust to represent their preferences. Make sure to ask this person for an agreement to act as your proxy. A proxy may have to exercise judgment in the event of a medical decision for which your wishes are not known. A medical power of attorney is a legal document that names your health care proxy. Depending on the laws in your state, after the document is written, it may also need to be:  Signed.  Notarized.  Dated.  Copied.  Witnessed.  Incorporated into your medical record. You may also want to appoint someone to manage your financial affairs in a situation in which you are unable to do so. This is called a durable power of attorney for finances. It is a separate legal document from the durable power of attorney for health care. You may choose the same person or someone different from your health care proxy to act as your agent in financial matters. If you do not appoint a proxy, or if there is a concern that the proxy is not acting in your best interests, a court-appointed guardian may be designated to  act on your behalf. Living will A living will is a set of instructions documenting your wishes about medical care when you cannot express them yourself. Health care providers should keep a copy of your living will in your medical record. You may want to give a copy to family members or friends. To alert caregivers in case of an emergency, you can place a card in your wallet to let them know that you have a living will and where they can find it. A living will is used if you become:  Terminally ill.  Incapacitated.  Unable to communicate or make decisions. Items to consider in  your living will include:  The use or non-use of life-sustaining equipment, such as dialysis machines and breathing machines (ventilators).  A DNR or DNAR order, which is the instruction not to use cardiopulmonary resuscitation (CPR) if breathing or heartbeat stops.  The use or non-use of tube feeding.  Withholding of food and fluids.  Comfort (palliative) care when the goal becomes comfort rather than a cure.  Organ and tissue donation. A living will does not give instructions for distributing your money and property if you should pass away. It is recommended that you seek the advice of a lawyer when writing a will. Decisions about taxes, beneficiaries, and asset distribution will be legally binding. This process can relieve your family and friends of any concerns surrounding disputes or questions that may come up about the distribution of your assets. DNR or DNAR A DNR or DNAR order is a request not to have CPR in the event that your heart stops beating or you stop breathing. If a DNR or DNAR order has not been made and shared, a health care provider will try to help any patient whose heart has stopped or who has stopped breathing. If you plan to have surgery, talk with your health care provider about how your DNR or DNAR order will be followed if problems occur. Summary  Advance directives are the legal documents that allow you to make choices ahead of time about your health care and medical treatment in case you become unable to communicate for yourself.  The process of discussing and writing advance directives should happen over time. You can change the advance directives, even after you have signed them.  Advance directives include DNR or DNAR orders, living wills, and designating an agent as your medical power of attorney. This information is not intended to replace advice given to you by your health care provider. Make sure you discuss any questions you have with your health care  provider. Document Released: 02/28/2008 Document Revised: 10/10/2016 Document Reviewed: 10/10/2016 Elsevier Interactive Patient Education  2019 [employer].

## 2019-04-25 NOTE — Progress Notes (Addendum)
MEDICARE ANNUAL WELLNESS VISIT  04/25/2019  Telephone Visit Disclaimer This Medicare AWV was conducted by telephone due to national recommendations for restrictions regarding the COVID-19 Pandemic (e.g. social distancing).  I verified, using two identifiers, that I am speaking with [name] [name] or their authorized healthcare agent. I discussed the limitations, risks, security, and privacy concerns of performing an evaluation and management service by telephone and the potential availability of an in-person appointment in the future. The patient expressed understanding and agreed to proceed.   Subjective:  [name] [name] is a 73 y.o. female patient of Terald Sleeper, PA-C who had a Medicare Annual Wellness Visit today via telephone. [name] [name] is Retired and lives alone. she has 3  children. she reports that she is socially active and does interact with friends/family regularly. she is not physically active and enjoys crafting.  Patient Care Team: Theodoro Clock as PCP - General (General Practice)  Advanced Directives 04/25/2019  Does Patient Have a Medical Advance Directive? No  Would patient like information on creating a medical advance directive? Yes (MAU/Ambulatory/Procedural Areas - Information given)    Hospital Utilization Over the Past 12 Months: # of hospitalizations or ER visits: 0 # of surgeries: 0  Review of Systems    Patient reports that her overall health is unchanged compared to last year.  Patient Reported Readings (BP, Pulse, CBG, Weight, etc) none  Review of Systems: History obtained from chart review and the patient General ROS: negative  All other systems negative.  Pain Assessment Pain : No/denies pain     Current Medications & Allergies (verified) Allergies as of 04/25/2019      Reactions   Augmentin [amoxicillin-pot Clavulanate] Other (See Comments)   Facial swelling      Medication List       Accurate as of Apr 25, 2019  2:56 PM. If  you have any questions, ask your nurse or doctor.        diclofenac 75 MG EC tablet Commonly known as:  VOLTAREN Take 1 tablet (75 mg total) by mouth 2 (two) times daily.   loratadine 10 MG tablet Commonly known as:  CLARITIN Take 1 tablet (10 mg total) by mouth daily.   losartan-hydrochlorothiazide 50-12.5 MG tablet Commonly known as:  HYZAAR Take 1 tablet by mouth daily.   pantoprazole 40 MG tablet Commonly known as:  PROTONIX Take 1 tablet (40 mg total) by mouth daily.   Vitamin D3 125 MCG (5000 UT) Caps Take by mouth.       History (reviewed): Past Medical History:  Diagnosis Date  . Allergic rhinitis   . HTN (hypertension)    Past Surgical History:  Procedure Laterality Date  . CARPAL TUNNEL RELEASE     RIGHT  . PROSTATE SURGERY    . TOTAL ABDOMINAL HYSTERECTOMY W/ BILATERAL SALPINGOOPHORECTOMY     Family History  Problem Relation Age of Onset  . Heart failure Mother        had pacemaker  . COPD Mother    Social History   Socioeconomic History  . Marital status: Divorced    Spouse name: Not on file  . Number of children: 2  . Years of education: Not on file  . Highest education level: 10th grade  Occupational History  . Occupation: Retired    Comment: retired from [employer] work  Social Needs  . Financial resource strain: Not hard at all  . Food insecurity:    Worry: Never true  Inability: Never true  . Transportation needs:    Medical: No    Non-medical: No  Tobacco Use  . Smoking status: Never Smoker  . Smokeless tobacco: Never Used  Substance and Sexual Activity  . Alcohol use: No  . Drug use: No  . Sexual activity: Not Currently  Lifestyle  . Physical activity:    Days per week: 0 days    Minutes per session: 0 min  . Stress: Not at all  Relationships  . Social connections:    Talks on phone: Once a week    Gets together: Once a week    Attends religious service: Never    Active member of club or organization: No     Attends meetings of clubs or organizations: Never    Relationship status: Divorced  Other Topics Concern  . Not on file  Social History Narrative   Has 3 children    Activities of Daily Living In your present state of health, do you have any difficulty performing the following activities: 04/25/2019  Hearing? N  Vision? N  Difficulty concentrating or making decisions? N  Walking or climbing stairs? N  Dressing or bathing? N  Doing errands, shopping? N  Preparing Food and eating ? N  Using the Toilet? N  In the past six months, have you accidently leaked urine? N  Do you have problems with loss of bowel control? N  Managing your Medications? N  Managing your Finances? N  Housekeeping or managing your Housekeeping? N  Some recent data might be hidden    Patient Literacy How often do you need to have someone help you when you read instructions, pamphlets, or other written materials from your doctor or pharmacy?: 1 - Never What is the last grade level you completed in school?: 10th Grade  Exercise Current Exercise Habits: The patient does not participate in regular exercise at present, Exercise limited by: None identified  Diet Patient reports consuming 2 meals a day and 4 snack(s) a day Patient reports that her primary diet is: Regular Patient reports that she does have regular access to food.   Depression Screen PHQ 2/9 Scores 05/23/2018 09/15/2017 08/11/2017  PHQ - 2 Score 0 0 0     Fall Risk Fall Risk  05/23/2018 09/15/2017 08/11/2017  Falls in the past year? No No No     Objective:  [name] [name] seemed alert and oriented and she participated appropriately during our telephone visit.  Blood Pressure Weight BMI  BP Readings from Last 3 Encounters:  05/23/18 139/82  09/15/17 130/85  08/11/17 133/87   Wt Readings from Last 3 Encounters:  04/25/19 186 lb (84.4 kg)  05/23/18 186 lb 6.4 oz (84.6 kg)  09/15/17 183 lb 6.4 oz (83.2 kg)   BMI Readings from Last 1  Encounters:  04/25/19 32.95 kg/m    *Unable to obtain current vital signs, weight, and BMI due to telephone visit type  Hearing/Vision  . [name] [name] did not seem to have difficulty with hearing/understanding during the telephone conversation . Reports that she has not had a formal eye exam by an eye care professional within the past year . Reports that she has not had a formal hearing evaluation within the past year *Unable to fully assess hearing and vision during telephone visit type  Cognitive Function: 6CIT Screen 04/25/2019  What Year? 0 points  What month? 0 points  What time? 0 points  Count back from 20 0 points  Months in reverse 0 points  Repeat phrase 0 points  Total Score 0    Normal Cognitive Function Screening: Yes (Normal:0-7, Significant for Dysfunction: >8)  Immunization & Health Maintenance Record  There is no immunization history on file for this patient.  Health Maintenance  Topic Date Due  . Hepatitis C Screening  [birth year]  . TETANUS/TDAP  03/04/1965  . COLONOSCOPY  03/04/1996  . PNA vac Low Risk Adult (1 of 2 - PCV13) 03/05/2011  . INFLUENZA VACCINE  07/06/2019  . MAMMOGRAM  02/10/2021  . DEXA SCAN  Completed       Assessment  This is a routine wellness examination for [name] [name].  Health Maintenance: Due or Overdue Health Maintenance Due  Topic Date Due  . Hepatitis C Screening  September 25, 1946  . TETANUS/TDAP  03/04/1965  . COLONOSCOPY  03/04/1996  . PNA vac Low Risk Adult (1 of 2 - PCV13) 03/05/2011    [name] [name] does not need a referral for Community Assistance: Care Management:   no Social Work:    no Prescription Assistance:  no Nutrition/Diabetes Education:  no   Plan:  Personalized Goals Goals Addressed            This Visit's Progress   . Exercise 3x per week (30 min per time)      . Have 3 meals a day        Personalized Health Maintenance & Screening Recommendations  Pneumococcal vaccine  Td vaccine Colorectal  cancer screening  Lung Cancer Screening Recommended: no (Low Dose CT Chest recommended if Age 78-80 years, 30 pack-year currently smoking OR have quit w/in past 15 years) Hepatitis C Screening recommended: yes HIV Screening recommended: no  Advanced Directives: Written information was prepared per patient's request.  Referrals & Orders No orders of the defined types were placed in this encounter.   Follow-up Plan . Follow-up with Terald Sleeper, PA-C as planned   I have personally reviewed and noted the following in the patient's chart:   . Medical and social history . Use of alcohol, tobacco or illicit drugs  . Current medications and supplements . Functional ability and status . Nutritional status . Physical activity . Advanced directives . List of other physicians . Hospitalizations, surgeries, and ER visits in previous 12 months . Vitals . Screenings to include cognitive, depression, and falls . Referrals and appointments  In addition, I have reviewed and discussed with [name] [name] certain preventive protocols, quality metrics, and best practice recommendations. A written personalized care plan for preventive services as well as general preventive health recommendations is available and can be mailed to the patient at her request.      Truett Mainland, LPN signature  [id number]   I have reviewed and agree with the above AWV documentation.   Mary-Margaret Hassell Done, FNP

## 2019-05-24 ENCOUNTER — Other Ambulatory Visit: Payer: Self-pay

## 2019-05-27 ENCOUNTER — Other Ambulatory Visit: Payer: Self-pay

## 2019-05-27 ENCOUNTER — Encounter: Payer: Self-pay | Admitting: Physician Assistant

## 2019-05-27 ENCOUNTER — Ambulatory Visit (INDEPENDENT_AMBULATORY_CARE_PROVIDER_SITE_OTHER): Payer: Medicare Other | Admitting: Physician Assistant

## 2019-05-27 VITALS — BP 139/82 | HR 77 | Temp 98.0°F | Ht 63.0 in | Wt 188.8 lb

## 2019-05-27 DIAGNOSIS — M722 Plantar fascial fibromatosis: Secondary | ICD-10-CM

## 2019-05-27 DIAGNOSIS — G629 Polyneuropathy, unspecified: Secondary | ICD-10-CM

## 2019-05-27 DIAGNOSIS — K219 Gastro-esophageal reflux disease without esophagitis: Secondary | ICD-10-CM

## 2019-05-27 DIAGNOSIS — I10 Essential (primary) hypertension: Secondary | ICD-10-CM

## 2019-05-27 DIAGNOSIS — M2550 Pain in unspecified joint: Secondary | ICD-10-CM

## 2019-05-27 DIAGNOSIS — Z0000 Encounter for general adult medical examination without abnormal findings: Secondary | ICD-10-CM

## 2019-05-27 MED ORDER — PANTOPRAZOLE SODIUM 40 MG PO TBEC: 40.0000 mg | DELAYED_RELEASE_TABLET | Freq: Every day | ORAL | 3 refills | Status: DC

## 2019-05-27 MED ORDER — VALSARTAN-HYDROCHLOROTHIAZIDE 80-12.5 MG PO TABS: 1.0000 | ORAL_TABLET | Freq: Every day | ORAL | 3 refills | Status: DC

## 2019-05-27 NOTE — Patient Instructions (Signed)
Vitamin B12 Deficiency Vitamin B12 deficiency occurs when the body does not have enough vitamin B12. Vitamin B12 is an important vitamin. The body needs vitamin B12:  To make red blood cells.  To make DNA. This is the genetic material inside cells.  To help the nerves work properly so they can carry messages from the brain to the body. Vitamin B12 deficiency can cause various health problems, such as a low red blood cell count (anemia) or nerve damage. What are the causes? This condition may be caused by:  Not eating enough foods that contain vitamin B12.  Not having enough stomach acid and digestive fluids to properly absorb vitamin B12 from the food that you eat.  Certain digestive system diseases that make it hard to absorb vitamin B12. These diseases include Crohn disease, chronic pancreatitis, and cystic fibrosis.  Pernicious anemia. This is a condition in which the body does not make enough of a protein (intrinsic factor), resulting in too few red blood cells.  Having a surgery in which part of the stomach or small intestine is removed.  Taking certain medicines that make it hard for the body to absorb vitamin B12. These medicines include: ? Heartburn medicine (antacids and proton pump inhibitors). ? An antibiotic medicine called neomycin. ? Some medicines that are used to treat diabetes, tuberculosis, gout, or high cholesterol. What increases the risk? The following factors may make you more likely to develop a B12 deficiency:  Being older than age 69.  Eating a vegetarian or vegan diet, especially while you are pregnant.  Eating a poor diet while you are pregnant.  Taking certain drugs.  Having alcoholism. What are the signs or symptoms? In some cases, there are no symptoms of this condition. If the condition leads to anemia or nerve damage, various symptoms can occur, such as:  Weakness.  Fatigue.  Loss of appetite.  Weight loss.  Numbness or tingling in your  hands and feet.  Redness and burning of the tongue.  Confusion or memory problems.  Depression.  Sensory problems, such as color blindness, ringing in the ears, or loss of taste.  Diarrhea or constipation.  Trouble walking. If anemia is severe, symptoms can include:  Shortness of breath.  Dizziness.  Rapid heart rate (tachycardia).  How is this diagnosed? This condition may be diagnosed with a blood test to measure the level of vitamin B12 in your blood. You may have other tests to help find the cause of your vitamin B12 deficiency. These tests may include:  A complete blood count (CBC). This is a group of tests that measure certain characteristics of blood cells.  A blood test to measure intrinsic factor.  An endoscopy. In this procedure, a thin tube with a camera on the end is used to look into your stomach or intestines. How is this treated? Treatment for this condition depends on the cause. Common treatment options include:  Changing your eating and drinking habits, such as: ? Eating more foods that contain vitamin B12. ? Drinking less alcohol or no alcohol.  Taking vitamin B12 supplements. Your health care provider will tell you which dosage is best for you.  Getting vitamin B12 injections. Follow these instructions at home:  Take supplements only as told by your health care provider. Follow the directions carefully.  Get any injections that are prescribed by your health care provider.  Do not miss your appointments.  Eat lots of healthy foods that contain vitamin B12. Ask your health care provider if  Follow these instructions at home:   Take supplements only as told by your health care provider. Follow the directions carefully.   Get any injections that are prescribed by your health care provider.  Do not miss your appointments.   Eat lots of healthy foods that contain vitamin B12. Ask your health care provider if you should work with a dietitian. Foods that contain vitamin B12 include:  ? Meat.  ? Meat from birds (poultry).  ? Fish.  ? Eggs.  ? Cereal and dairy products that are fortified. This means that vitamin B12 has been added to the food. Check the label on the package to see if the food is fortified.   Do not abuse alcohol.   Keep all follow-up visits as told by your  health care provider. This is important.  Contact a health care provider if:   Your symptoms come back.  Get help right away if:   You develop shortness of breath.   You have chest pain.   You become dizzy or you lose consciousness.  This information is not intended to replace advice given to you by your health care provider. Make sure you discuss any questions you have with your health care provider.  Document Released: 02/13/2012 Document Revised: 05/04/2016 Document Reviewed: 04/08/2015  Elsevier Interactive Patient Education  2019 [employer]

## 2019-05-27 NOTE — Progress Notes (Signed)
BP 139/82    Pulse 77    Temp 98 F (36.7 C) (Oral)    Ht '5\' 3"'$  (1.6 m)    Wt 188 lb 12.8 oz (85.6 kg)    BMI 33.44 kg/m    Subjective:    Patient ID: [name] [name], female    DOB: Sep 09, 1946, 73 y.o.   MRN: [id number]  HPI: [name] [name] is a 73 y.o. female presenting on 05/27/2019 for Medical Management of Chronic Issues (1 year follow up ) and burning in feet  This patient comes in for 1 year follow-up on her chronic conditions that do include hypertension, plantar fasciitis, GERD.  She states several of those things are fairly stable.  Even her plantar fascia has done very well with her wearing shoes as much as possible.  However she is having some numbness and tingling in the feet up to just above the ankle.  This is been going on for about 1 year.  She denies any injury.  She has no history of sciatica or degenerative disc disease.  Her daughter does have B12 deficiency.  We have discussed this possibility and the possibility of other neuropathies.  If the B12 is normal we will plan for titration of gabapentin and neurology referral.  Past Medical History:  Diagnosis Date   Allergic rhinitis    HTN (hypertension)    Relevant past medical, surgical, family and social history reviewed and updated as indicated. Interim medical history since our last visit reviewed. Allergies and medications reviewed and updated. DATA REVIEWED: CHART IN EPIC  Family History reviewed for pertinent findings.  Review of Systems  Constitutional: Negative.   HENT: Negative.   Eyes: Negative.   Respiratory: Negative.   Gastrointestinal: Negative.   Genitourinary: Negative.   Musculoskeletal: Positive for arthralgias.  Neurological: Positive for weakness and numbness.    Allergies as of 05/27/2019      Reactions   Augmentin [amoxicillin-pot Clavulanate] Other (See Comments)   Facial swelling      Medication List       Accurate as of May 27, 2019  2:20 PM. If you have any questions, ask your  nurse or doctor.        STOP taking these medications   hydrochlorothiazide 12.5 MG capsule Commonly known as: MICROZIDE Stopped by: Terald Sleeper, PA-C   losartan 50 MG tablet Commonly known as: COZAAR Stopped by: Terald Sleeper, PA-C   losartan-hydrochlorothiazide 50-12.5 MG tablet Commonly known as: HYZAAR Stopped by: Terald Sleeper, PA-C     TAKE these medications   diclofenac 75 MG EC tablet Commonly known as: VOLTAREN Take 1 tablet (75 mg total) by mouth 2 (two) times daily.   loratadine 10 MG tablet Commonly known as: CLARITIN Take 1 tablet (10 mg total) by mouth daily.   pantoprazole 40 MG tablet Commonly known as: PROTONIX Take 1 tablet (40 mg total) by mouth daily.   valsartan-hydrochlorothiazide 80-12.5 MG tablet Commonly known as: Diovan HCT Take 1 tablet by mouth daily. Started by: Terald Sleeper, PA-C   Vitamin D3 125 MCG (5000 UT) Caps Take by mouth.          Objective:    BP 139/82    Pulse 77    Temp 98 F (36.7 C) (Oral)    Ht '5\' 3"'$  (1.6 m)    Wt 188 lb 12.8 oz (85.6 kg)    BMI 33.44 kg/m   Allergies  Allergen Reactions   Augmentin [Amoxicillin-Pot Clavulanate] Other (  See Comments)    Facial swelling    Wt Readings from Last 3 Encounters:  05/27/19 188 lb 12.8 oz (85.6 kg)  04/25/19 186 lb (84.4 kg)  05/23/18 186 lb 6.4 oz (84.6 kg)    Physical Exam Constitutional:      Appearance: She is well-developed.  HENT:     Head: Normocephalic and atraumatic.  Eyes:     Conjunctiva/sclera: Conjunctivae normal.     Pupils: Pupils are equal, round, and reactive to light.  Cardiovascular:     Rate and Rhythm: Normal rate and regular rhythm.     Pulses:          Dorsalis pedis pulses are 2+ on the right side and 2+ on the left side.       Posterior tibial pulses are 2+ on the right side and 2+ on the left side.     Heart sounds: Normal heart sounds.     Comments: Slight pink coloration of the ankles and feet bilaterally Pulmonary:      Effort: Pulmonary effort is normal.     Breath sounds: Normal breath sounds.  Abdominal:     General: Bowel sounds are normal.     Palpations: Abdomen is soft.  Skin:    General: Skin is warm and dry.     Findings: No rash.  Neurological:     Mental Status: She is alert and oriented to person, place, and time.     Sensory: Sensory deficit present.     Motor: No weakness.     Deep Tendon Reflexes: Reflexes are normal and symmetric.  Psychiatric:        Behavior: Behavior normal.        Thought Content: Thought content normal.        Judgment: Judgment normal.     Results for orders placed or performed in visit on 02/18/19  HM MAMMOGRAPHY  Result Value Ref Range   HM Mammogram 0-4 Bi-Rad 0-4 Bi-Rad, Self Reported Normal      Assessment & Plan:   1. Arthralgia, unspecified joint Continue diclofenac as needed   2. Plantar fasciitis Continue shoe wear as much as possible  3. Well adult exam - CBC with Differential - Vitamin B12 - CMP14+EGFR - Lipid panel - TSH  4. Essential hypertension - valsartan-hydrochlorothiazide (DIOVAN HCT) 80-12.5 MG tablet; Take 1 tablet by mouth daily.  Dispense: 90 tablet; Refill: 3  5. Neuropathy We will be testing today for B12, and then treating.  If this is negative we will plan for gabapentin titration 100 mg at bed increasing every 2 weeks x 100 mg to a goal of 300 mg nightly - Vitamin B12  6. Gastroesophageal reflux disease without esophagitis Very well controlled GERD - pantoprazole (PROTONIX) 40 MG tablet; Take 1 tablet (40 mg total) by mouth daily.  Dispense: 90 tablet; Refill: 3   Continue all other maintenance medications as listed above.  Follow up plan: No follow-ups on file.  Educational handout given for B12 deficiency  Terald Sleeper PA-C [place] [address]  [place], [place] [address] [phone]   05/27/2019, 2:20 PM

## 2019-05-28 ENCOUNTER — Other Ambulatory Visit: Payer: Self-pay | Admitting: Physician Assistant

## 2019-05-28 DIAGNOSIS — N289 Disorder of kidney and ureter, unspecified: Secondary | ICD-10-CM

## 2019-05-28 LAB — CMP14+EGFR
ALT: 20 IU/L (ref 0–32)
AST: 26 IU/L (ref 0–40)
Albumin/Globulin Ratio: 1.6 (ref 1.2–2.2)
Albumin: 4.2 g/dL (ref 3.7–4.7)
Alkaline Phosphatase: 74 IU/L (ref 39–117)
BUN/Creatinine Ratio: 20 (ref 12–28)
BUN: 26 mg/dL (ref 8–27)
Bilirubin Total: 0.4 mg/dL (ref 0.0–1.2)
CO2: 21 mmol/L (ref 20–29)
Calcium: 9.5 mg/dL (ref 8.7–10.3)
Chloride: 102 mmol/L (ref 96–106)
Creatinine, Ser: 1.33 mg/dL — ABNORMAL HIGH (ref 0.57–1.00)
GFR calc Af Amer: 46 mL/min/{1.73_m2} — ABNORMAL LOW (ref >59)
GFR calc non Af Amer: 40 mL/min/{1.73_m2} — ABNORMAL LOW (ref >59)
Globulin, Total: 2.6 g/dL (ref 1.5–4.5)
Glucose: 90 mg/dL (ref 65–99)
Potassium: 4.5 mmol/L (ref 3.5–5.2)
Sodium: 141 mmol/L (ref 134–144)
Total Protein: 6.8 g/dL (ref 6.0–8.5)

## 2019-05-28 LAB — VITAMIN B12: Vitamin B-12: 360 pg/mL (ref 232–1245)

## 2019-05-28 LAB — CBC WITH DIFFERENTIAL/PLATELET
Basophils Absolute: 0 10*3/uL (ref 0.0–0.2)
Basos: 1 %
EOS (ABSOLUTE): 0.1 10*3/uL (ref 0.0–0.4)
Eos: 1 %
Hematocrit: 41 % (ref 34.0–46.6)
Hemoglobin: 12.9 g/dL (ref 11.1–15.9)
Immature Grans (Abs): 0 10*3/uL (ref 0.0–0.1)
Immature Granulocytes: 0 %
Lymphocytes Absolute: 2 10*3/uL (ref 0.7–3.1)
Lymphs: 25 %
MCH: 27.2 pg (ref 26.6–33.0)
MCHC: 31.5 g/dL (ref 31.5–35.7)
MCV: 87 fL (ref 79–97)
Monocytes Absolute: 0.5 10*3/uL (ref 0.1–0.9)
Monocytes: 7 %
Neutrophils Absolute: 5.2 10*3/uL (ref 1.4–7.0)
Neutrophils: 66 %
Platelets: 302 10*3/uL (ref 150–450)
RBC: 4.74 x10E6/uL (ref 3.77–5.28)
RDW: 13.4 % (ref 11.7–15.4)
WBC: 7.9 10*3/uL (ref 3.4–10.8)

## 2019-05-28 LAB — LIPID PANEL
Chol/HDL Ratio: 2.5 ratio (ref 0.0–4.4)
Cholesterol, Total: 190 mg/dL (ref 100–199)
HDL: 75 mg/dL (ref >39)
LDL Calculated: 93 mg/dL (ref 0–99)
Triglycerides: 109 mg/dL (ref 0–149)
VLDL Cholesterol Cal: 22 mg/dL (ref 5–40)

## 2019-05-28 LAB — TSH: TSH: 3.36 u[IU]/mL (ref 0.450–4.500)

## 2019-06-03 ENCOUNTER — Telehealth: Payer: Self-pay | Admitting: Physician Assistant

## 2019-06-03 ENCOUNTER — Other Ambulatory Visit: Payer: Self-pay | Admitting: Physician Assistant

## 2019-06-03 DIAGNOSIS — G629 Polyneuropathy, unspecified: Secondary | ICD-10-CM

## 2019-06-03 MED ORDER — GABAPENTIN 100 MG PO CAPS: 100.0000 mg | ORAL_CAPSULE | Freq: Three times a day (TID) | ORAL | 2 refills | Status: DC

## 2019-06-03 NOTE — Telephone Encounter (Signed)
Aware of provider's instruction.

## 2019-06-03 NOTE — Telephone Encounter (Signed)
A new prescription for gabapentin has been sent to the pharmacy.  It is for 100 mg I would like for her to take 1 at bedtime and increase by 1 capsule every 2 to 4 weeks as she can tolerate it.  The goal was to reach 300 mg at bedtime.

## 2019-06-03 NOTE — Telephone Encounter (Signed)
Please review and advise on medication.

## 2019-06-12 ENCOUNTER — Other Ambulatory Visit: Payer: Medicare Other

## 2019-06-12 ENCOUNTER — Other Ambulatory Visit: Payer: Self-pay

## 2019-06-13 LAB — BMP8+EGFR
BUN/Creatinine Ratio: 13 (ref 12–28)
BUN: 15 mg/dL (ref 8–27)
CO2: 21 mmol/L (ref 20–29)
Calcium: 9.6 mg/dL (ref 8.7–10.3)
Chloride: 103 mmol/L (ref 96–106)
Creatinine, Ser: 1.15 mg/dL — ABNORMAL HIGH (ref 0.57–1.00)
GFR calc Af Amer: 55 mL/min/{1.73_m2} — ABNORMAL LOW (ref >59)
GFR calc non Af Amer: 47 mL/min/{1.73_m2} — ABNORMAL LOW (ref >59)
Glucose: 96 mg/dL (ref 65–99)
Potassium: 4.6 mmol/L (ref 3.5–5.2)
Sodium: 143 mmol/L (ref 134–144)

## 2019-06-13 LAB — MICROALBUMIN / CREATININE URINE RATIO
Creatinine, Urine: 244.4 mg/dL
Microalb/Creat Ratio: 6 mg/g creat (ref 0–29)
Microalbumin, Urine: 14.4 ug/mL

## 2019-09-09 ENCOUNTER — Other Ambulatory Visit: Payer: Self-pay | Admitting: Physician Assistant

## 2019-09-09 ENCOUNTER — Telehealth: Payer: Self-pay | Admitting: Physician Assistant

## 2019-09-09 DIAGNOSIS — G629 Polyneuropathy, unspecified: Secondary | ICD-10-CM

## 2019-09-09 MED ORDER — GABAPENTIN 300 MG PO CAPS: 300.0000 mg | ORAL_CAPSULE | Freq: Three times a day (TID) | ORAL | 5 refills | Status: DC

## 2019-09-09 NOTE — Telephone Encounter (Signed)
Sent as requested.

## 2019-09-09 NOTE — Telephone Encounter (Signed)
Aware. 

## 2019-09-09 NOTE — Telephone Encounter (Signed)
She needs to have a refill on gabapentin.  Could you change the pilll from a 100 mg(take 3) to a 300 mg , take up to three times daily? Send to [facility],[place].

## 2020-06-10 ENCOUNTER — Other Ambulatory Visit: Payer: Self-pay | Admitting: *Deleted

## 2020-06-10 DIAGNOSIS — I10 Essential (primary) hypertension: Secondary | ICD-10-CM

## 2020-06-10 NOTE — Telephone Encounter (Signed)
Former Ronnald Ramp. NTBS LOV 05/27/19

## 2020-06-16 ENCOUNTER — Ambulatory Visit: Payer: Medicare PPO | Admitting: Family Medicine

## 2020-06-16 ENCOUNTER — Encounter: Payer: Self-pay | Admitting: Nurse Practitioner

## 2020-06-16 ENCOUNTER — Ambulatory Visit (INDEPENDENT_AMBULATORY_CARE_PROVIDER_SITE_OTHER): Payer: Medicare PPO | Admitting: Nurse Practitioner

## 2020-06-16 ENCOUNTER — Other Ambulatory Visit: Payer: Self-pay

## 2020-06-16 VITALS — BP 129/81 | HR 77 | Temp 97.7°F | Ht 63.0 in | Wt 191.8 lb

## 2020-06-16 DIAGNOSIS — G629 Polyneuropathy, unspecified: Secondary | ICD-10-CM

## 2020-06-16 DIAGNOSIS — I10 Essential (primary) hypertension: Secondary | ICD-10-CM | POA: Diagnosis not present

## 2020-06-16 DIAGNOSIS — K219 Gastro-esophageal reflux disease without esophagitis: Secondary | ICD-10-CM

## 2020-06-16 HISTORY — DX: Polyneuropathy, unspecified: G62.9

## 2020-06-16 MED ORDER — PANTOPRAZOLE SODIUM 40 MG PO TBEC: 40.0000 mg | DELAYED_RELEASE_TABLET | Freq: Every day | ORAL | 3 refills | Status: DC

## 2020-06-16 MED ORDER — PREGABALIN 25 MG PO CAPS: 25.0000 mg | ORAL_CAPSULE | Freq: Two times a day (BID) | ORAL | 0 refills | Status: DC

## 2020-06-16 MED ORDER — VALSARTAN-HYDROCHLOROTHIAZIDE 80-12.5 MG PO TABS: 1.0000 | ORAL_TABLET | Freq: Every day | ORAL | 3 refills | Status: DC

## 2020-06-16 NOTE — Patient Instructions (Addendum)
HTN (hypertension) Patient is a 74 year old female who presents to clinic today with follow-up hypertension.  Patient is tolerating medication well without side effect, including taking medication as directed maintain a healthy diet and regular exercise.  No changes to current medication.  Advised patient to continue eating a healthy diet and continue exercise.  Refill sent to pharmacy Patient knows to follow-up as needed.  Neuropathy Concerning patient's neuropathy, this is not new for patient she is reporting increased weakness bilateral lower extremity, dizziness, headache, drowsiness and muscle aches.  Patient is attributing symptoms to the use of gabapentin.  Provided education to patient with printed handouts.  Discontinued gabapentin tapering patient off. Started patient on Lyrica. Follow-up in 2 to 4 weeks. Rx sent to pharmacy.   Neuropathic Pain Neuropathic pain is pain caused by damage to the nerves that are responsible for certain sensations in your body (sensory nerves). The pain can be caused by:  Damage to the sensory nerves that send signals to your spinal cord and brain (peripheral nervous system).  Damage to the sensory nerves in your brain or spinal cord (central nervous system). Neuropathic pain can make you more sensitive to pain. Even a minor sensation can feel very painful. This is usually a long-term condition that can be difficult to treat. The type of pain differs from person to person. It may:  Start suddenly (acute), or it may develop slowly and last for a long time (chronic).  Come and go as damaged nerves heal, or it may stay at the same level for years.  Cause emotional distress, loss of sleep, and a lower quality of life. What are the causes? The most common cause of this condition is diabetes. Many other diseases and conditions can also cause neuropathic pain. Causes of neuropathic pain can be classified as:  Toxic. This is caused by medicines and chemicals.  The most common cause of toxic neuropathic pain is damage from cancer treatments (chemotherapy).  Metabolic. This can be caused by: ? Diabetes. This is the most common disease that damages the nerves. ? Lack of vitamin B from long-term alcohol abuse.  Traumatic. Any injury that cuts, crushes, or stretches a nerve can cause damage and pain. A common example is feeling pain after losing an arm or leg (phantom limb pain).  Compression-related. If a sensory nerve gets trapped or compressed for a long period of time, the blood supply to the nerve can be cut off.  Vascular. Many blood vessel diseases can cause neuropathic pain by decreasing blood supply and oxygen to nerves.  Autoimmune. This type of pain results from diseases in which the body's defense system (immune system) mistakenly attacks sensory nerves. Examples of autoimmune diseases that can cause neuropathic pain include lupus and multiple sclerosis.  Infectious. Many types of viral infections can damage sensory nerves and cause pain. Shingles infection is a common cause of this type of pain.  Inherited. Neuropathic pain can be a symptom of many diseases that are passed down through families (genetic). What increases the risk? You are more likely to develop this condition if:  You have diabetes.  You smoke.  You drink too much alcohol.  You are taking certain medicines, including medicines that kill cancer cells (chemotherapy) or that treat immune system disorders. What are the signs or symptoms? The main symptom is pain. Neuropathic pain is often described as:  Burning.  Shock-like.  Stinging.  Hot or cold.  Itching. How is this diagnosed? No single test can diagnose neuropathic pain.  It is diagnosed based on:  Physical exam and your symptoms. Your health care provider will ask you about your pain. You may be asked to use a pain scale to describe how bad your pain is.  Tests. These may be done to see if you have a  high sensitivity to pain and to help find the cause and location of any sensory nerve damage. They include: ? Nerve conduction studies to test how well nerve signals travel through your sensory nerves (electrodiagnostic testing). ? Stimulating your sensory nerves through electrodes on your skin and measuring the response in your spinal cord and brain (somatosensory evoked potential).  Imaging studies, such as: ? X-rays. ? CT scan. ? MRI. How is this treated? Treatment for neuropathic pain may change over time. You may need to try different treatment options or a combination of treatments. Some options include:  Treating the underlying cause of the neuropathy, such as diabetes, kidney disease, or vitamin deficiencies.  Stopping medicines that can cause neuropathy, such as chemotherapy.  Medicine to relieve pain. Medicines may include: ? Prescription or over-the-counter pain medicine. ? Anti-seizure medicine. ? Antidepressant medicines. ? Pain-relieving patches that are applied to painful areas of skin. ? A medicine to numb the area (local anesthetic), which can be injected as a nerve block.  Transcutaneous nerve stimulation. This uses electrical currents to block painful nerve signals. The treatment is painless.  Alternative treatments, such as: ? Acupuncture. ? Meditation. ? Massage. ? Physical therapy. ? Pain management programs. ? Counseling. Follow these instructions at home: Medicines   Take over-the-counter and prescription medicines only as told by your health care provider.  Do not drive or use heavy machinery while taking prescription pain medicine.  If you are taking prescription pain medicine, take actions to prevent or treat constipation. Your health care provider may recommend that you: ? Drink enough fluid to keep your urine pale yellow. ? Eat foods that are high in fiber, such as fresh fruits and vegetables, whole grains, and beans. ? Limit foods that are high  in fat and processed sugars, such as fried or sweet foods. ? Take an over-the-counter or prescription medicine for constipation. Lifestyle   Have a good support system at home.  Consider joining a chronic pain support group.  Do not use any products that contain nicotine or tobacco, such as cigarettes and e-cigarettes. If you need help quitting, ask your health care provider.  Do not drink alcohol. General instructions  Learn as much as you can about your condition.  Work closely with all your health care providers to find the treatment plan that works best for you.  Ask your health care provider what activities are safe for you.  Keep all follow-up visits as told by your health care provider. This is important. Contact a health care provider if:  Your pain treatments are not working.  You are having side effects from your medicines.  You are struggling with tiredness (fatigue), mood changes, depression, or anxiety. Summary  Neuropathic pain is pain caused by damage to the nerves that are responsible for certain sensations in your body (sensory nerves).  Neuropathic pain may come and go as damaged nerves heal, or it may stay at the same level for years.  Neuropathic pain is usually a long-term condition that can be difficult to treat. Consider joining a chronic pain support group. This information is not intended to replace advice given to you by your health care provider. Make sure you discuss any questions  you have with your health care provider. Document Revised: 03/14/2019 Document Reviewed: 12/08/2017 Elsevier Patient Education  [employer].    Hypertension, Adult High blood pressure (hypertension) is when the force of blood pumping through the arteries is too strong. The arteries are the blood vessels that carry blood from the heart throughout the body. Hypertension forces the heart to work harder to pump blood and may cause arteries to become narrow or stiff.  Untreated or uncontrolled hypertension can cause a heart attack, heart failure, a stroke, kidney disease, and other problems. A blood pressure reading consists of a higher number over a lower number. Ideally, your blood pressure should be below 120/80. The first ("top") number is called the systolic pressure. It is a measure of the pressure in your arteries as your heart beats. The second ("bottom") number is called the diastolic pressure. It is a measure of the pressure in your arteries as the heart relaxes. What are the causes? The exact cause of this condition is not known. There are some conditions that result in or are related to high blood pressure. What increases the risk? Some risk factors for high blood pressure are under your control. The following factors may make you more likely to develop this condition:  Smoking.  Having type 2 diabetes mellitus, high cholesterol, or both.  Not getting enough exercise or physical activity.  Being overweight.  Having too much fat, sugar, calories, or salt (sodium) in your diet.  Drinking too much alcohol. Some risk factors for high blood pressure may be difficult or impossible to change. Some of these factors include:  Having chronic kidney disease.  Having a family history of high blood pressure.  Age. Risk increases with age.  Race. You may be at higher risk if you are African American.  Gender. Men are at higher risk than women before age 15. After age 57, women are at higher risk than men.  Having obstructive sleep apnea.  Stress. What are the signs or symptoms? High blood pressure may not cause symptoms. Very high blood pressure (hypertensive crisis) may cause:  Headache.  Anxiety.  Shortness of breath.  Nosebleed.  Nausea and vomiting.  Vision changes.  Severe chest pain.  Seizures. How is this diagnosed? This condition is diagnosed by measuring your blood pressure while you are seated, with your arm resting on a  flat surface, your legs uncrossed, and your feet flat on the floor. The cuff of the blood pressure monitor will be placed directly against the skin of your upper arm at the level of your heart. It should be measured at least twice using the same arm. Certain conditions can cause a difference in blood pressure between your right and left arms. Certain factors can cause blood pressure readings to be lower or higher than normal for a short period of time:  When your blood pressure is higher when you are in a health care provider's office than when you are at home, this is called white coat hypertension. Most people with this condition do not need medicines.  When your blood pressure is higher at home than when you are in a health care provider's office, this is called masked hypertension. Most people with this condition may need medicines to control blood pressure. If you have a high blood pressure reading during one visit or you have normal blood pressure with other risk factors, you may be asked to:  Return on a different day to have your blood pressure checked again.  Monitor your blood pressure at home for 1 week or longer. If you are diagnosed with hypertension, you may have other blood or imaging tests to help your health care provider understand your overall risk for other conditions. How is this treated? This condition is treated by making healthy lifestyle changes, such as eating healthy foods, exercising more, and reducing your alcohol intake. Your health care provider may prescribe medicine if lifestyle changes are not enough to get your blood pressure under control, and if:  Your systolic blood pressure is above 130.  Your diastolic blood pressure is above 80. Your personal target blood pressure may vary depending on your medical conditions, your age, and other factors. Follow these instructions at home: Eating and drinking   Eat a diet that is high in fiber and potassium, and low in  sodium, added sugar, and fat. An example eating plan is called the DASH (Dietary Approaches to Stop Hypertension) diet. To eat this way: ? Eat plenty of fresh fruits and vegetables. Try to fill one half of your plate at each meal with fruits and vegetables. ? Eat whole grains, such as whole-wheat pasta, brown rice, or whole-grain bread. Fill about one fourth of your plate with whole grains. ? Eat or drink low-fat dairy products, such as skim milk or low-fat yogurt. ? Avoid fatty cuts of meat, processed or cured meats, and poultry with skin. Fill about one fourth of your plate with lean proteins, such as fish, chicken without skin, beans, eggs, or tofu. ? Avoid pre-made and processed foods. These tend to be higher in sodium, added sugar, and fat.  Reduce your daily sodium intake. Most people with hypertension should eat less than 1,500 mg of sodium a day.  Do not drink alcohol if: ? Your health care provider tells you not to drink. ? You are pregnant, may be pregnant, or are planning to become pregnant.  If you drink alcohol: ? Limit how much you use to:  0-1 drink a day for women.  0-2 drinks a day for men. ? Be aware of how much alcohol is in your drink. In the [place] one drink equals one 12 oz bottle of beer (355 mL), one 5 oz glass of wine (148 mL), or one 1 oz glass of hard liquor (44 mL). Lifestyle   Work with your health care provider to maintain a healthy body weight or to lose weight. Ask what an ideal weight is for you.  Get at least 30 minutes of exercise most days of the week. Activities may include walking, swimming, or biking.  Include exercise to strengthen your muscles (resistance exercise), such as Pilates or lifting weights, as part of your weekly exercise routine. Try to do these types of exercises for 30 minutes at least 3 days a week.  Do not use any products that contain nicotine or tobacco, such as cigarettes, e-cigarettes, and chewing tobacco. If you need help  quitting, ask your health care provider.  Monitor your blood pressure at home as told by your health care provider.  Keep all follow-up visits as told by your health care provider. This is important. Medicines  Take over-the-counter and prescription medicines only as told by your health care provider. Follow directions carefully. Blood pressure medicines must be taken as prescribed.  Do not skip doses of blood pressure medicine. Doing this puts you at risk for problems and can make the medicine less effective.  Ask your health care provider about side effects or reactions to medicines  that you should watch for. Contact a health care provider if you:  Think you are having a reaction to a medicine you are taking.  Have headaches that keep coming back (recurring).  Feel dizzy.  Have swelling in your ankles.  Have trouble with your vision. Get help right away if you:  Develop a severe headache or confusion.  Have unusual weakness or numbness.  Feel faint.  Have severe pain in your chest or abdomen.  Vomit repeatedly.  Have trouble breathing. Summary  Hypertension is when the force of blood pumping through your arteries is too strong. If this condition is not controlled, it may put you at risk for serious complications.  Your personal target blood pressure may vary depending on your medical conditions, your age, and other factors. For most people, a normal blood pressure is less than 120/80.  Hypertension is treated with lifestyle changes, medicines, or a combination of both. Lifestyle changes include losing weight, eating a healthy, low-sodium diet, exercising more, and limiting alcohol. This information is not intended to replace advice given to you by your health care provider. Make sure you discuss any questions you have with your health care provider. Document Revised: 08/01/2018 Document Reviewed: 08/01/2018 Elsevier Patient Education  2020 [employer].

## 2020-06-16 NOTE — Progress Notes (Signed)
Established Patient Office Visit  Subjective:  Patient ID: [name] [name], female    DOB: 1946-03-05  Age: 74 y.o. MRN: [id number]  CC:  Chief Complaint  Patient presents with   Medication Refill    HPI [name] [name] presents for follow up of hypertension. Patient was diagnosed in 09/28/2012 The patient is tolerating the medication well without side effects. Compliance with treatment has been good; including taking medication as directed , maintains a healthy diet and regular exercise regimen , and following up as directed.   Neuropathy: Patient is reporting increased weakness, dizziness, headache, drowsiness, muscle aches, slight nausea, tingling and weakness in bilateral lower extremities.  Patient reports since taking gabapentin symptoms have worsened.  Patient reports not taking medication as prescribed but only taking 1 capsule 300 mg once daily.  Patient reports symptoms have been ongoing for a few weeks.  Past Medical History:  Diagnosis Date   Allergic rhinitis    HTN (hypertension)     Past Surgical History:  Procedure Laterality Date   CARPAL TUNNEL RELEASE     RIGHT   PROSTATE SURGERY     TOTAL ABDOMINAL HYSTERECTOMY W/ BILATERAL SALPINGOOPHORECTOMY      Family History  Problem Relation Age of Onset   Heart failure Mother        had pacemaker   COPD Mother     Social History   Socioeconomic History   Marital status: Divorced    Spouse name: Not on file   Number of children: 2   Years of education: Not on file   Highest education level: 10th grade  Occupational History   Occupation: Retired    Comment: retired from [employer] work  Tobacco Use   Smoking status: Never Smoker   Smokeless tobacco: Never Used  Scientific laboratory technician Use: Never used  Substance and Sexual Activity   Alcohol use: No   Drug use: No   Sexual activity: Not Currently  Other Topics Concern   Not on file  Social History Narrative   Has 3 children    Social Determinants of Radio broadcast assistant Strain:    Difficulty of Paying Living Expenses:   Food Insecurity:    Worried About Charity fundraiser in the Last Year:    Arboriculturist in the Last Year:   Transportation Needs:    Film/video editor (Medical):    Lack of Transportation (Non-Medical):   Physical Activity:    Days of Exercise per Week:    Minutes of Exercise per Session:   Stress:    Feeling of Stress :   Social Connections:    Frequency of Communication with Friends and Family:    Frequency of Social Gatherings with Friends and Family:    Attends Religious Services:    Active Member of Clubs or Organizations:    Attends Music therapist:    Marital Status:   Intimate Partner Violence:    Fear of Current or Ex-Partner:    Emotionally Abused:    Physically Abused:    Sexually Abused:     Outpatient Medications Prior to Visit  Medication Sig Dispense Refill   Cholecalciferol (VITAMIN D3) 5000 units CAPS Take by mouth.     diclofenac (VOLTAREN) 75 MG EC tablet Take 1 tablet (75 mg total) by mouth 2 (two) times daily. (Patient taking differently: Take 75 mg by mouth 2 (two) times daily as needed. ) 180 tablet 3   gabapentin (NEURONTIN)  300 MG capsule Take 1 capsule (300 mg total) by mouth 3 (three) times daily. Increase by one capsule every 2-4 weeks as tolerated 90 capsule 5   pantoprazole (PROTONIX) 40 MG tablet Take 1 tablet (40 mg total) by mouth daily. 90 tablet 3   POTASSIUM PO Take by mouth daily.     valsartan-hydrochlorothiazide (DIOVAN HCT) 80-12.5 MG tablet Take 1 tablet by mouth daily. 90 tablet 3   loratadine (CLARITIN) 10 MG tablet Take 1 tablet (10 mg total) by mouth daily. (Patient not taking: Reported on 06/16/2020) 30 tablet 0   No facility-administered medications prior to visit.    Allergies  Allergen Reactions   Augmentin [Amoxicillin-Pot Clavulanate] Other (See Comments)    Facial  swelling    ROS Review of Systems  Constitutional: Negative.   Respiratory: Negative.   Cardiovascular: Negative.   Gastrointestinal: Negative.   Genitourinary: Negative.   Musculoskeletal: Negative.   Skin: Negative for color change and rash.  Neurological: Positive for weakness, light-headedness and numbness.  Psychiatric/Behavioral: Negative.       Objective:    Physical Exam Vitals reviewed.  Constitutional:      Appearance: Normal appearance.  HENT:     Head: Normocephalic.     Nose: Nose normal.  Eyes:     Conjunctiva/sclera: Conjunctivae normal.  Cardiovascular:     Rate and Rhythm: Normal rate and regular rhythm.     Pulses: Normal pulses.  Pulmonary:     Effort: Pulmonary effort is normal.     Breath sounds: Normal breath sounds.  Abdominal:     General: Bowel sounds are normal.  Musculoskeletal:        General: Normal range of motion.  Skin:    General: Skin is warm.  Neurological:     Mental Status: She is alert and oriented to person, place, and time.     Motor: Weakness present.     Comments: Tingling  Psychiatric:        Mood and Affect: Mood normal.        Behavior: Behavior normal.     Ht _0  (1.6 m)    Wt 191 lb 12.8 oz (87 kg)    BMI 33.98 kg/m  Wt Readings from Last 3 Encounters:  06/16/20 191 lb 12.8 oz (87 kg)  05/27/19 188 lb 12.8 oz (85.6 kg)  04/25/19 186 lb (84.4 kg)     Health Maintenance Due  Topic Date Due   Hepatitis C Screening  Never done   COVID-19 Vaccine (1) Never done   TETANUS/TDAP  Never done   COLONOSCOPY  Never done   PNA vac Low Risk Adult (1 of 2 - PCV13) Never done    There are no preventive care reminders to display for this patient.  Lab Results  Component Value Date   TSH 3.360 05/27/2019   Lab Results  Component Value Date   WBC 7.9 05/27/2019   HGB 12.9 05/27/2019   HCT 41.0 05/27/2019   MCV 87 05/27/2019   PLT 302 05/27/2019   Lab Results  Component Value Date   NA 143 06/12/2019    K 4.6 06/12/2019   CO2 21 06/12/2019   GLUCOSE 96 06/12/2019   BUN 15 06/12/2019   CREATININE 1.15 (H) 06/12/2019   BILITOT 0.4 05/27/2019   ALKPHOS 74 05/27/2019   AST 26 05/27/2019   ALT 20 05/27/2019   PROT 6.8 05/27/2019   ALBUMIN 4.2 05/27/2019   CALCIUM 9.6 06/12/2019   Lab Results  Component Value Date   CHOL 190 05/27/2019   Lab Results  Component Value Date   HDL 75 05/27/2019   Lab Results  Component Value Date   LDLCALC 93 05/27/2019   Lab Results  Component Value Date   TRIG 109 05/27/2019   Lab Results  Component Value Date   CHOLHDL 2.5 05/27/2019   No results found for: HGBA1C    Assessment & Plan:  HTN (hypertension) Patient is a 74 year old female who presents to clinic today with follow-up hypertension.  Patient is tolerating medication well without side effect, including taking medication as directed maintain a healthy diet and regular exercise.  No changes to current medication.  Advised patient to continue eating a healthy diet and continue exercise.  Refill sent to pharmacy Patient knows to follow-up as needed.  Neuropathy Concerning patient's neuropathy, this is not new for patient she is reporting increased weakness bilateral lower extremity, dizziness, headache, drowsiness and muscle aches.  Patient is attributing symptoms to the use of gabapentin.  Provided education to patient with printed handouts.  Discontinued gabapentin tapering patient off. Started patient on Lyrica. Follow-up in 2 to 4 weeks. Rx sent to pharmacy.  Problem List Items Addressed This Visit      Cardiovascular and Mediastinum   HTN (hypertension) - Primary    Patient is a 74 year old female who presents to clinic today with follow-up hypertension.  Patient is tolerating medication well without side effect, including taking medication as directed maintain a healthy diet and regular exercise.  No changes to current medication.  Advised patient to continue eating a  healthy diet and continue exercise.  Refill sent to pharmacy Patient knows to follow-up as needed.      Relevant Medications   valsartan-hydrochlorothiazide (DIOVAN HCT) 80-12.5 MG tablet   Other Relevant Orders   CBC with Differential   CMP14+EGFR   TSH   Lipid Panel     Nervous and Auditory   Neuropathy   Relevant Medications   pregabalin (LYRICA) 25 MG capsule    Other Visit Diagnoses    Gastroesophageal reflux disease without esophagitis       Relevant Medications   pantoprazole (PROTONIX) 40 MG tablet      Meds ordered this encounter  Medications   pregabalin (LYRICA) 25 MG capsule    Sig: Take 1 capsule (25 mg total) by mouth 2 (two) times daily.    Dispense:  60 capsule    Refill:  0    Order Specific Question:   Supervising Provider    Answer:   Caryl Pina A [[id number]]   valsartan-hydrochlorothiazide (DIOVAN HCT) 80-12.5 MG tablet    Sig: Take 1 tablet by mouth daily.    Dispense:  90 tablet    Refill:  3    Order Specific Question:   Supervising Provider    Answer:   Caryl Pina A [[id number]]   pantoprazole (PROTONIX) 40 MG tablet    Sig: Take 1 tablet (40 mg total) by mouth daily.    Dispense:  90 tablet    Refill:  3    Order Specific Question:   Supervising Provider    Answer:   Caryl Pina A [[id number]]    Follow-up: Return if symptoms worsen or fail to improve.    Ivy Lynn, NP

## 2020-06-16 NOTE — Assessment & Plan Note (Signed)
Concerning patient's neuropathy, this is not new for patient she is reporting increased weakness bilateral lower extremity, dizziness, headache, drowsiness and muscle aches.  Patient is attributing symptoms to the use of gabapentin.  Provided education to patient with printed handouts.  Discontinued gabapentin tapering patient off. Started patient on Lyrica. Follow-up in 2 to 4 weeks. Rx sent to pharmacy.

## 2020-06-16 NOTE — Assessment & Plan Note (Signed)
Patient is a 74 year old female who presents to clinic today with follow-up hypertension.  Patient is tolerating medication well without side effect, including taking medication as directed maintain a healthy diet and regular exercise.  No changes to current medication.  Advised patient to continue eating a healthy diet and continue exercise.  Refill sent to pharmacy Patient knows to follow-up as needed.

## 2020-06-17 ENCOUNTER — Other Ambulatory Visit: Payer: Self-pay | Admitting: Nurse Practitioner

## 2020-06-17 DIAGNOSIS — R944 Abnormal results of kidney function studies: Secondary | ICD-10-CM | POA: Insufficient documentation

## 2020-06-17 DIAGNOSIS — R7989 Other specified abnormal findings of blood chemistry: Secondary | ICD-10-CM | POA: Insufficient documentation

## 2020-06-17 LAB — CBC WITH DIFFERENTIAL/PLATELET
Basophils Absolute: 0 10*3/uL (ref 0.0–0.2)
Basos: 1 %
EOS (ABSOLUTE): 0.1 10*3/uL (ref 0.0–0.4)
Eos: 2 %
Hematocrit: 43.2 % (ref 34.0–46.6)
Hemoglobin: 13.5 g/dL (ref 11.1–15.9)
Immature Grans (Abs): 0 10*3/uL (ref 0.0–0.1)
Immature Granulocytes: 0 %
Lymphocytes Absolute: 1.6 10*3/uL (ref 0.7–3.1)
Lymphs: 20 %
MCH: 26.2 pg — ABNORMAL LOW (ref 26.6–33.0)
MCHC: 31.3 g/dL — ABNORMAL LOW (ref 31.5–35.7)
MCV: 84 fL (ref 79–97)
Monocytes Absolute: 0.5 10*3/uL (ref 0.1–0.9)
Monocytes: 6 %
Neutrophils Absolute: 5.7 10*3/uL (ref 1.4–7.0)
Neutrophils: 71 %
Platelets: 324 10*3/uL (ref 150–450)
RBC: 5.15 x10E6/uL (ref 3.77–5.28)
RDW: 13.4 % (ref 11.7–15.4)
WBC: 7.9 10*3/uL (ref 3.4–10.8)

## 2020-06-17 LAB — CMP14+EGFR
ALT: 16 IU/L (ref 0–32)
AST: 23 IU/L (ref 0–40)
Albumin/Globulin Ratio: 1.7 (ref 1.2–2.2)
Albumin: 4.4 g/dL (ref 3.7–4.7)
Alkaline Phosphatase: 79 IU/L (ref 48–121)
BUN/Creatinine Ratio: 12 (ref 12–28)
BUN: 14 mg/dL (ref 8–27)
Bilirubin Total: 0.3 mg/dL (ref 0.0–1.2)
CO2: 22 mmol/L (ref 20–29)
Calcium: 10 mg/dL (ref 8.7–10.3)
Chloride: 102 mmol/L (ref 96–106)
Creatinine, Ser: 1.19 mg/dL — ABNORMAL HIGH (ref 0.57–1.00)
GFR calc Af Amer: 52 mL/min/{1.73_m2} — ABNORMAL LOW (ref >59)
GFR calc non Af Amer: 45 mL/min/{1.73_m2} — ABNORMAL LOW (ref >59)
Globulin, Total: 2.6 g/dL (ref 1.5–4.5)
Glucose: 96 mg/dL (ref 65–99)
Potassium: 4.7 mmol/L (ref 3.5–5.2)
Sodium: 141 mmol/L (ref 134–144)
Total Protein: 7 g/dL (ref 6.0–8.5)

## 2020-06-17 LAB — LIPID PANEL
Chol/HDL Ratio: 2.7 ratio (ref 0.0–4.4)
Cholesterol, Total: 214 mg/dL — ABNORMAL HIGH (ref 100–199)
HDL: 79 mg/dL (ref >39)
LDL Chol Calc (NIH): 114 mg/dL — ABNORMAL HIGH (ref 0–99)
Triglycerides: 123 mg/dL (ref 0–149)
VLDL Cholesterol Cal: 21 mg/dL (ref 5–40)

## 2020-06-17 LAB — TSH: TSH: 7.94 u[IU]/mL — ABNORMAL HIGH (ref 0.450–4.500)

## 2020-06-20 LAB — T4, FREE: Free T4: 1.22 ng/dL (ref 0.82–1.77)

## 2020-06-20 LAB — SPECIMEN STATUS REPORT

## 2020-06-22 ENCOUNTER — Other Ambulatory Visit: Payer: Self-pay | Admitting: Nurse Practitioner

## 2020-06-22 MED ORDER — ROSUVASTATIN CALCIUM 5 MG PO TABS: 5.0000 mg | ORAL_TABLET | Freq: Every day | ORAL | 0 refills | Status: DC

## 2020-06-22 MED ORDER — LEVOTHYROXINE SODIUM 50 MCG PO TABS
50.0000 ug | ORAL_TABLET | Freq: Every day | ORAL | 3 refills | Status: DC
Start: 2020-06-22 — End: 2020-10-21

## 2020-07-22 ENCOUNTER — Encounter: Payer: Self-pay | Admitting: Family Medicine

## 2020-07-22 ENCOUNTER — Other Ambulatory Visit: Payer: Self-pay

## 2020-07-22 ENCOUNTER — Ambulatory Visit (INDEPENDENT_AMBULATORY_CARE_PROVIDER_SITE_OTHER): Payer: Medicare PPO | Admitting: Family Medicine

## 2020-07-22 VITALS — BP 135/76 | HR 73 | Temp 97.9°F | Ht 63.0 in | Wt 192.2 lb

## 2020-07-22 DIAGNOSIS — I10 Essential (primary) hypertension: Secondary | ICD-10-CM | POA: Diagnosis not present

## 2020-07-22 DIAGNOSIS — R944 Abnormal results of kidney function studies: Secondary | ICD-10-CM | POA: Diagnosis not present

## 2020-07-22 DIAGNOSIS — G629 Polyneuropathy, unspecified: Secondary | ICD-10-CM | POA: Diagnosis not present

## 2020-07-22 DIAGNOSIS — R7989 Other specified abnormal findings of blood chemistry: Secondary | ICD-10-CM | POA: Diagnosis not present

## 2020-07-22 DIAGNOSIS — E782 Mixed hyperlipidemia: Secondary | ICD-10-CM

## 2020-07-22 MED ORDER — PREGABALIN 50 MG PO CAPS: 50.0000 mg | ORAL_CAPSULE | Freq: Two times a day (BID) | ORAL | 2 refills | Status: DC

## 2020-07-22 MED ORDER — ROSUVASTATIN CALCIUM 5 MG PO TABS: 5.0000 mg | ORAL_TABLET | Freq: Every day | ORAL | 1 refills | Status: DC

## 2020-07-22 NOTE — Patient Instructions (Signed)
  Only take Tylenol for pain.  No more sodas.   Chronic Kidney Disease, Adult Chronic kidney disease (CKD) happens when the kidneys are damaged over a long period of time. The kidneys are two organs that help with:  Getting rid of waste and extra fluid from the blood.  Making hormones that maintain the amount of fluid in your tissues and blood vessels.  Making sure that the body has the right amount of fluids and chemicals. Most of the time, CKD does not go away, but it can usually be controlled. Steps must be taken to slow down the kidney damage or to stop it from getting worse. If this is not done, the kidneys may stop working. Follow these instructions at home: Medicines  Take over-the-counter and prescription medicines only as told by your doctor. You may need to change the amount of medicines you take.  Do not take any new medicines unless your doctor says it is okay. Many medicines can make your kidney damage worse.  Do not take any vitamin and supplements unless your doctor says it is okay. Many vitamins and supplements can make your kidney damage worse. General instructions  Follow a diet as told by your doctor. You may need to stay away from: ? Alcohol. ? Salty foods. ? Foods that are high in:  Potassium.  Calcium.  Protein.  Do not use any products that contain nicotine or tobacco, such as cigarettes and e-cigarettes. If you need help quitting, ask your doctor.  Keep track of your blood pressure at home. Tell your doctor about any changes.  If you have diabetes, keep track of your blood sugar as told by your doctor.  Try to stay at a healthy weight. If you need help, ask your doctor.  Exercise at least 30 minutes a day, 5 days a week.  Stay up-to-date with your shots (immunizations) as told by your doctor.  Keep all follow-up visits as told by your doctor. This is important. Contact a doctor if:  Your symptoms get worse.  You have new symptoms. Get help  right away if:  You have symptoms of end-stage kidney disease. These may include: ? Headaches. ? Numbness in your hands or feet. ? Easy bruising. ? Having hiccups often. ? Chest pain. ? Shortness of breath. ? Stopping of menstrual periods in women.  You have a fever.  You have very little pee (urine).  You have pain or bleeding when you pee. Summary  Chronic kidney disease (CKD) happens when the kidneys are damaged over a long period of time.  Most of the time, this condition does not go away, but it can usually be controlled. Steps must be taken to slow down the kidney damage or to stop it from getting worse.  Treatment may include a combination of medicines and lifestyle changes. This information is not intended to replace advice given to you by your health care provider. Make sure you discuss any questions you have with your health care provider. Document Revised: 11/03/2017 Document Reviewed: 12/26/2016 Elsevier Patient Education  2020 [employer].

## 2020-07-22 NOTE — Progress Notes (Signed)
Assessment & Plan:  1. Essential hypertension - Well controlled on current regimen.   2. Neuropathy - Uncontrolled.  Lyrica increased from 25 mg to 50 mg twice daily. - pregabalin (LYRICA) 50 MG capsule; Take 1 capsule (50 mg total) by mouth 2 (two) times daily.  Dispense: 60 capsule; Refill: 2  3. TSH elevation - Continue levothyroxine.  Too soon to recheck levels.  4. Decreased GFR - Advised patient she does not have to schedule with nephrology at this time.  Encouraged her to only take Tylenol for pain, and no NSAIDs.  Also to stop drinking sodas.  Education provided on chronic kidney disease.  5. Mixed hyperlipidemia - Continue rosuvastatin. - rosuvastatin (CRESTOR) 5 MG tablet; Take 1 tablet (5 mg total) by mouth daily.  Dispense: 90 tablet; Refill: 1   Return in about 3 weeks (around 08/12/2020) for neuropathy, thyroid.  Hendricks Limes, MSN, APRN, FNP-C [facility]  Subjective:    Patient ID: [name] [name], female    DOB: 1946-06-19, 74 y.o.   MRN: [id number]  Patient Care Team: Loman Brooklyn, FNP as PCP - General (Family Medicine)   Chief Complaint:  Chief Complaint  Patient presents with  . Establish Care    Jones pt.  Follow up on neuropathy- patient states she is no better and still having burning in bilateral legs and feet.  . Hypertension    4 week follow up     HPI: [name] [name] is a 74 y.o. female presenting on 07/22/2020 for [facility] Ronnald Ramp pt.  Follow up on neuropathy- patient states she is no better and still having burning in bilateral legs and feet.) and Hypertension (4 week follow up )  Patient is here to establish care.  She was previously seeing Particia Nearing.  Patient was seen about a month ago at which time she was started on Lyrica 25 mg twice daily for neuropathy.  Patient states she cannot tell a difference at all and continues to feel burning, tingling, and numbness in her feet and legs.  She was started on the  Lyrica as she felt she was having side effects from the gabapentin.  The muscle cramps that she was having have resolved.  Also at her last visit when her labs resulted she was started on levothyroxine and rosuvastatin.  A referral was placed to nephrology due to CKD, but patient was unaware of this so when they call to schedule she said that she did not need the appointment.   Social history:  Relevant past medical, surgical, family and social history reviewed and updated as indicated. Interim medical history since our last visit reviewed.  Allergies and medications reviewed and updated.  DATA REVIEWED: CHART IN EPIC  ROS: Negative unless specifically indicated above in HPI.    Current Outpatient Medications:  .  Cholecalciferol (VITAMIN D3) 5000 units CAPS, Take by mouth., Disp: , Rfl:  .  diclofenac (VOLTAREN) 75 MG EC tablet, Take 1 tablet (75 mg total) by mouth 2 (two) times daily. (Patient taking differently: Take 75 mg by mouth 2 (two) times daily as needed. ), Disp: 180 tablet, Rfl: 3 .  levothyroxine (SYNTHROID) 50 MCG tablet, Take 1 tablet (50 mcg total) by mouth daily., Disp: 30 tablet, Rfl: 3 .  loratadine (CLARITIN) 10 MG tablet, Take 1 tablet (10 mg total) by mouth daily., Disp: 30 tablet, Rfl: 0 .  pantoprazole (PROTONIX) 40 MG tablet, Take 1 tablet (40 mg total) by mouth daily., Disp: 90 tablet,  Rfl: 3 .  POTASSIUM PO, Take by mouth daily., Disp: , Rfl:  .  pregabalin (LYRICA) 25 MG capsule, Take 1 capsule (25 mg total) by mouth 2 (two) times daily., Disp: 60 capsule, Rfl: 0 .  rosuvastatin (CRESTOR) 5 MG tablet, Take 1 tablet (5 mg total) by mouth daily., Disp: 60 tablet, Rfl: 0 .  valsartan-hydrochlorothiazide (DIOVAN HCT) 80-12.5 MG tablet, Take 1 tablet by mouth daily., Disp: 90 tablet, Rfl: 3   Allergies  Allergen Reactions  . Augmentin [Amoxicillin-Pot Clavulanate] Other (See Comments)    Facial swelling   Past Medical History:  Diagnosis Date  . Allergic  rhinitis   . HTN (hypertension)     Past Surgical History:  Procedure Laterality Date  . CARPAL TUNNEL RELEASE     RIGHT  . PROSTATE SURGERY    . TOTAL ABDOMINAL HYSTERECTOMY W/ BILATERAL SALPINGOOPHORECTOMY      Social History   Socioeconomic History  . Marital status: Divorced    Spouse name: Not on file  . Number of children: 2  . Years of education: Not on file  . Highest education level: 10th grade  Occupational History  . Occupation: Retired    Comment: retired from [employer] work  Tobacco Use  . Smoking status: Never Smoker  . Smokeless tobacco: Never Used  Vaping Use  . Vaping Use: Never used  Substance and Sexual Activity  . Alcohol use: No  . Drug use: No  . Sexual activity: Not Currently  Other Topics Concern  . Not on file  Social History Narrative   Has 3 children   Social Determinants of Health   Financial Resource Strain:   . Difficulty of Paying Living Expenses:   Food Insecurity:   . Worried About Charity fundraiser in the Last Year:   . Arboriculturist in the Last Year:   Transportation Needs:   . Film/video editor (Medical):   Marland Kitchen Lack of Transportation (Non-Medical):   Physical Activity:   . Days of Exercise per Week:   . Minutes of Exercise per Session:   Stress:   . Feeling of Stress :   Social Connections:   . Frequency of Communication with Friends and Family:   . Frequency of Social Gatherings with Friends and Family:   . Attends Religious Services:   . Active Member of Clubs or Organizations:   . Attends Archivist Meetings:   Marland Kitchen Marital Status:   Intimate Partner Violence:   . Fear of Current or Ex-Partner:   . Emotionally Abused:   Marland Kitchen Physically Abused:   . Sexually Abused:         Objective:    BP 135/76   Pulse 73   Temp 97.9 F (36.6 C) (Temporal)   Ht 5\' 3"  (1.6 m)   Wt 192 lb 3.2 oz (87.2 kg)   SpO2 98%   BMI 34.05 kg/m   Wt Readings from Last 3 Encounters:  07/22/20 192 lb 3.2 oz (87.2  kg)  06/16/20 191 lb 12.8 oz (87 kg)  05/27/19 188 lb 12.8 oz (85.6 kg)    Physical Exam Vitals reviewed.  Constitutional:      General: She is not in acute distress.    Appearance: Normal appearance. She is obese. She is not ill-appearing, toxic-appearing or diaphoretic.  HENT:     Head: Normocephalic and atraumatic.  Eyes:     General: No scleral icterus.       Right eye: No  discharge.        Left eye: No discharge.     Conjunctiva/sclera: Conjunctivae normal.  Cardiovascular:     Rate and Rhythm: Normal rate and regular rhythm.     Heart sounds: Normal heart sounds. No murmur heard.  No friction rub. No gallop.   Pulmonary:     Effort: Pulmonary effort is normal. No respiratory distress.     Breath sounds: Normal breath sounds. No stridor. No wheezing, rhonchi or rales.  Musculoskeletal:        General: Normal range of motion.     Cervical back: Normal range of motion.  Skin:    General: Skin is warm and dry.     Capillary Refill: Capillary refill takes less than 2 seconds.  Neurological:     General: No focal deficit present.     Mental Status: She is alert and oriented to person, place, and time. Mental status is at baseline.  Psychiatric:        Mood and Affect: Mood normal.        Behavior: Behavior normal.        Thought Content: Thought content normal.        Judgment: Judgment normal.     Lab Results  Component Value Date   TSH 7.940 (H) 06/16/2020   Lab Results  Component Value Date   WBC 7.9 06/16/2020   HGB 13.5 06/16/2020   HCT 43.2 06/16/2020   MCV 84 06/16/2020   PLT 324 06/16/2020   Lab Results  Component Value Date   NA 141 06/16/2020   K 4.7 06/16/2020   CO2 22 06/16/2020   GLUCOSE 96 06/16/2020   BUN 14 06/16/2020   CREATININE 1.19 (H) 06/16/2020   BILITOT 0.3 06/16/2020   ALKPHOS 79 06/16/2020   AST 23 06/16/2020   ALT 16 06/16/2020   PROT 7.0 06/16/2020   ALBUMIN 4.4 06/16/2020   CALCIUM 10.0 06/16/2020   Lab Results   Component Value Date   CHOL 214 (H) 06/16/2020   Lab Results  Component Value Date   HDL 79 06/16/2020   Lab Results  Component Value Date   LDLCALC 114 (H) 06/16/2020   Lab Results  Component Value Date   TRIG 123 06/16/2020   Lab Results  Component Value Date   CHOLHDL 2.7 06/16/2020   No results found for: HGBA1C

## 2020-07-27 DIAGNOSIS — E782 Mixed hyperlipidemia: Secondary | ICD-10-CM

## 2020-07-27 HISTORY — DX: Mixed hyperlipidemia: E78.2

## 2020-08-12 ENCOUNTER — Encounter: Payer: Self-pay | Admitting: Family Medicine

## 2020-08-12 ENCOUNTER — Other Ambulatory Visit: Payer: Self-pay

## 2020-08-12 ENCOUNTER — Ambulatory Visit: Payer: Medicare PPO | Admitting: Family Medicine

## 2020-08-12 VITALS — BP 137/87 | HR 70 | Temp 97.3°F | Ht 63.0 in | Wt 191.0 lb

## 2020-08-12 DIAGNOSIS — R7989 Other specified abnormal findings of blood chemistry: Secondary | ICD-10-CM | POA: Diagnosis not present

## 2020-08-12 DIAGNOSIS — G629 Polyneuropathy, unspecified: Secondary | ICD-10-CM | POA: Diagnosis not present

## 2020-08-12 MED ORDER — PREGABALIN 75 MG PO CAPS: 75.0000 mg | ORAL_CAPSULE | Freq: Two times a day (BID) | ORAL | 2 refills | Status: DC

## 2020-08-12 NOTE — Progress Notes (Signed)
Assessment & Plan:  1. Neuropathy - Improving.  Lyrica increased from 50 mg to 75 mg twice daily. - pregabalin (LYRICA) 75 MG capsule; Take 1 capsule (75 mg total) by mouth 2 (two) times daily.  Dispense: 60 capsule; Refill: 2  2. TSH elevation - Thyroid Panel With TSH   Return in about 6 weeks (around 09/23/2020) for follow-up of chronic medication conditions.  Hendricks Limes, MSN, APRN, FNP-C [facility]  Subjective:    Patient ID: [name] [name], female    DOB: 08/05/1946, 74 y.o.   MRN: [id number]  Patient Care Team: Loman Brooklyn, FNP as PCP - General (Family Medicine)   Chief Complaint:  Chief Complaint  Patient presents with  . follow up on neuropathy    Patient states that she is still having burning but states it is a little better since medication change.  . thyroid check    HPI: [name] [name] is a 74 y.o. female presenting on 08/12/2020 for follow up on neuropathy (Patient states that she is still having burning but states it is a little better since medication change.) and thyroid check  Patient is here for recheck of neuropathy and to have her thyroid level rechecked.  At her last visit Lyrica was increased from 25 mg to 50 mg twice daily.  She reports it has improved the burning pain, but she does still have some.  New complaints: None  Social history:  Relevant past medical, surgical, family and social history reviewed and updated as indicated. Interim medical history since our last visit reviewed.  Allergies and medications reviewed and updated.  DATA REVIEWED: CHART IN EPIC  ROS: Negative unless specifically indicated above in HPI.    Current Outpatient Medications:  .  Cholecalciferol (VITAMIN D3) 5000 units CAPS, Take by mouth., Disp: , Rfl:  .  levothyroxine (SYNTHROID) 50 MCG tablet, Take 1 tablet (50 mcg total) by mouth daily., Disp: 30 tablet, Rfl: 3 .  loratadine (CLARITIN) 10 MG tablet, Take 1 tablet (10 mg total)  by mouth daily., Disp: 30 tablet, Rfl: 0 .  pantoprazole (PROTONIX) 40 MG tablet, Take 1 tablet (40 mg total) by mouth daily., Disp: 90 tablet, Rfl: 3 .  POTASSIUM PO, Take by mouth daily., Disp: , Rfl:  .  pregabalin (LYRICA) 50 MG capsule, Take 1 capsule (50 mg total) by mouth 2 (two) times daily., Disp: 60 capsule, Rfl: 2 .  rosuvastatin (CRESTOR) 5 MG tablet, Take 1 tablet (5 mg total) by mouth daily., Disp: 90 tablet, Rfl: 1 .  valsartan-hydrochlorothiazide (DIOVAN HCT) 80-12.5 MG tablet, Take 1 tablet by mouth daily., Disp: 90 tablet, Rfl: 3   Allergies  Allergen Reactions  . Augmentin [Amoxicillin-Pot Clavulanate] Other (See Comments)    Facial swelling   Past Medical History:  Diagnosis Date  . Allergic rhinitis   . HTN (hypertension)     Past Surgical History:  Procedure Laterality Date  . CARPAL TUNNEL RELEASE     RIGHT  . PROSTATE SURGERY    . TOTAL ABDOMINAL HYSTERECTOMY W/ BILATERAL SALPINGOOPHORECTOMY      Social History   Socioeconomic History  . Marital status: Divorced    Spouse name: Not on file  . Number of children: 2  . Years of education: Not on file  . Highest education level: 10th grade  Occupational History  . Occupation: Retired    Comment: retired from [employer] work  Tobacco Use  . Smoking status: Never Smoker  . Smokeless tobacco: Never  Used  Vaping Use  . Vaping Use: Never used  Substance and Sexual Activity  . Alcohol use: No  . Drug use: No  . Sexual activity: Not Currently  Other Topics Concern  . Not on file  Social History Narrative   Has 3 children   Social Determinants of Health   Financial Resource Strain:   . Difficulty of Paying Living Expenses: Not on file  Food Insecurity:   . Worried About Charity fundraiser in the Last Year: Not on file  . Ran Out of Food in the Last Year: Not on file  Transportation Needs:   . Lack of Transportation (Medical): Not on file  . Lack of Transportation (Non-Medical): Not on file   Physical Activity:   . Days of Exercise per Week: Not on file  . Minutes of Exercise per Session: Not on file  Stress:   . Feeling of Stress : Not on file  Social Connections:   . Frequency of Communication with Friends and Family: Not on file  . Frequency of Social Gatherings with Friends and Family: Not on file  . Attends Religious Services: Not on file  . Active Member of Clubs or Organizations: Not on file  . Attends Archivist Meetings: Not on file  . Marital Status: Not on file  Intimate Partner Violence:   . Fear of Current or Ex-Partner: Not on file  . Emotionally Abused: Not on file  . Physically Abused: Not on file  . Sexually Abused: Not on file        Objective:    BP 137/87   Pulse 70   Temp (!) 97.3 F (36.3 C) (Temporal)   Ht 5\' 3"  (1.6 m)   Wt 191 lb (86.6 kg)   SpO2 98%   BMI 33.83 kg/m   Wt Readings from Last 3 Encounters:  08/12/20 191 lb (86.6 kg)  07/22/20 192 lb 3.2 oz (87.2 kg)  06/16/20 191 lb 12.8 oz (87 kg)    Physical Exam Vitals reviewed.  Constitutional:      General: She is not in acute distress.    Appearance: Normal appearance. She is obese. She is not ill-appearing, toxic-appearing or diaphoretic.  HENT:     Head: Normocephalic and atraumatic.  Eyes:     General: No scleral icterus.       Right eye: No discharge.        Left eye: No discharge.     Conjunctiva/sclera: Conjunctivae normal.  Cardiovascular:     Rate and Rhythm: Normal rate and regular rhythm.     Heart sounds: Murmur heard.  No friction rub. No gallop.   Pulmonary:     Effort: Pulmonary effort is normal. No respiratory distress.     Breath sounds: Normal breath sounds. No stridor. No wheezing, rhonchi or rales.  Musculoskeletal:        General: Normal range of motion.     Cervical back: Normal range of motion.  Skin:    General: Skin is warm and dry.     Capillary Refill: Capillary refill takes less than 2 seconds.  Neurological:     General: No  focal deficit present.     Mental Status: She is alert and oriented to person, place, and time. Mental status is at baseline.  Psychiatric:        Mood and Affect: Mood normal.        Behavior: Behavior normal.        Thought Content:  Thought content normal.        Judgment: Judgment normal.     Lab Results  Component Value Date   TSH 7.940 (H) 06/16/2020   Lab Results  Component Value Date   WBC 7.9 06/16/2020   HGB 13.5 06/16/2020   HCT 43.2 06/16/2020   MCV 84 06/16/2020   PLT 324 06/16/2020   Lab Results  Component Value Date   NA 141 06/16/2020   K 4.7 06/16/2020   CO2 22 06/16/2020   GLUCOSE 96 06/16/2020   BUN 14 06/16/2020   CREATININE 1.19 (H) 06/16/2020   BILITOT 0.3 06/16/2020   ALKPHOS 79 06/16/2020   AST 23 06/16/2020   ALT 16 06/16/2020   PROT 7.0 06/16/2020   ALBUMIN 4.4 06/16/2020   CALCIUM 10.0 06/16/2020   Lab Results  Component Value Date   CHOL 214 (H) 06/16/2020   Lab Results  Component Value Date   HDL 79 06/16/2020   Lab Results  Component Value Date   LDLCALC 114 (H) 06/16/2020   Lab Results  Component Value Date   TRIG 123 06/16/2020   Lab Results  Component Value Date   CHOLHDL 2.7 06/16/2020   No results found for: HGBA1C

## 2020-08-13 LAB — THYROID PANEL WITH TSH
Free Thyroxine Index: 2.6 (ref 1.2–4.9)
T3 Uptake Ratio: 26 % (ref 24–39)
T4, Total: 10 ug/dL (ref 4.5–12.0)
TSH: 0.942 u[IU]/mL (ref 0.450–4.500)

## 2020-08-16 ENCOUNTER — Encounter: Payer: Self-pay | Admitting: Family Medicine

## 2020-10-20 ENCOUNTER — Ambulatory Visit (INDEPENDENT_AMBULATORY_CARE_PROVIDER_SITE_OTHER): Payer: Medicare PPO

## 2020-10-20 ENCOUNTER — Encounter: Payer: Self-pay | Admitting: Nurse Practitioner

## 2020-10-20 ENCOUNTER — Ambulatory Visit: Payer: Medicare PPO | Admitting: Nurse Practitioner

## 2020-10-20 ENCOUNTER — Other Ambulatory Visit: Payer: Self-pay

## 2020-10-20 VITALS — BP 125/67 | HR 73 | Temp 97.3°F | Resp 20 | Ht 63.0 in | Wt 192.0 lb

## 2020-10-20 DIAGNOSIS — I10 Essential (primary) hypertension: Secondary | ICD-10-CM

## 2020-10-20 DIAGNOSIS — K219 Gastro-esophageal reflux disease without esophagitis: Secondary | ICD-10-CM

## 2020-10-20 DIAGNOSIS — Z780 Asymptomatic menopausal state: Secondary | ICD-10-CM | POA: Diagnosis not present

## 2020-10-20 DIAGNOSIS — E782 Mixed hyperlipidemia: Secondary | ICD-10-CM

## 2020-10-20 DIAGNOSIS — R7989 Other specified abnormal findings of blood chemistry: Secondary | ICD-10-CM

## 2020-10-20 DIAGNOSIS — R42 Dizziness and giddiness: Secondary | ICD-10-CM

## 2020-10-20 DIAGNOSIS — M8588 Other specified disorders of bone density and structure, other site: Secondary | ICD-10-CM | POA: Diagnosis not present

## 2020-10-20 MED ORDER — MECLIZINE HCL 25 MG PO TABS: 25.0000 mg | ORAL_TABLET | Freq: Three times a day (TID) | ORAL | 0 refills | Status: DC | PRN

## 2020-10-20 NOTE — Assessment & Plan Note (Signed)
Well-controlled on current medication no changes necessary.  Continue to provide education to patient with printed handouts given.  Continue healthy diet and exercise regimen as tolerated. Follow-up with worsening or unresolved symptoms.

## 2020-10-20 NOTE — Progress Notes (Signed)
Established Patient Office Visit  Subjective:  Patient ID: [name] [name], female    DOB: Mar 28, 1946  Age: 74 y.o. MRN: [id number]  CC:  Chief Complaint  Patient presents with  . Medical Management of Chronic Issues    6 mo   . Hypertension  . Hyperlipidemia    HPI [name] [name]  Presents for  hyperlipidemia. Patient was diagnosed in 07/27/2020. Compliance with treatment has been poor; The patient is not compliant with medications, patient tries to maintains a low cholesterol diet , follows up as directed . The patient denies experiencing any hypercholesterolemia related symptoms.  Lipid panel labs will be drawn today to compare previous results.   Patienti presents for follow up of hypertension. Patient was diagnosed in 06/16/2020.The patient is tolerating the medication well without side effects. Compliance with treatment has been good; including taking medication as directed , maintains a healthy diet and regular exercise regimen , and following up as directed.  Current medication valsartan-hydrochlorothiazide 80-12.5 mg tablet daily   Thyroid: Patient presents for follow up evaluation of hypothyroidism. Current symptoms include fatigue, feeling cold and cold intolerance. Patient denies palpitations, sweating.   Dizziness This is a new problem. The current episode started more than 1 month ago. The problem occurs daily. The problem has been unchanged. Associated symptoms include chest pain, headaches and vertigo. Pertinent negatives include no coughing, nausea, visual change or vomiting. The symptoms are aggravated by standing and walking. She has tried nothing for the symptoms. The treatment provided no relief.   Past Medical History:  Diagnosis Date  . Allergic rhinitis   . HTN (hypertension)     Past Surgical History:  Procedure Laterality Date  . CARPAL TUNNEL RELEASE     RIGHT  . PROSTATE SURGERY    . TOTAL ABDOMINAL HYSTERECTOMY W/ BILATERAL SALPINGOOPHORECTOMY       Family History  Problem Relation Age of Onset  . Heart failure Mother        had pacemaker  . COPD Mother       Outpatient Medications Prior to Visit  Medication Sig Dispense Refill  . Cholecalciferol (VITAMIN D3) 5000 units CAPS Take by mouth.    . levothyroxine (SYNTHROID) 50 MCG tablet Take 1 tablet (50 mcg total) by mouth daily. 30 tablet 3  . loratadine (CLARITIN) 10 MG tablet Take 1 tablet (10 mg total) by mouth daily. 30 tablet 0  . pantoprazole (PROTONIX) 40 MG tablet Take 1 tablet (40 mg total) by mouth daily. 90 tablet 3  . POTASSIUM PO Take by mouth daily.    . pregabalin (LYRICA) 75 MG capsule Take 1 capsule (75 mg total) by mouth 2 (two) times daily. 60 capsule 2  . valsartan-hydrochlorothiazide (DIOVAN HCT) 80-12.5 MG tablet Take 1 tablet by mouth daily. 90 tablet 3  . rosuvastatin (CRESTOR) 5 MG tablet Take 1 tablet (5 mg total) by mouth daily. (Patient not taking: Reported on 10/20/2020) 90 tablet 1   No facility-administered medications prior to visit.    Allergies  Allergen Reactions  . Augmentin [Amoxicillin-Pot Clavulanate] Other (See Comments)    Facial swelling    ROS Review of Systems  Respiratory: Negative for cough.   Cardiovascular: Positive for chest pain.  Gastrointestinal: Negative for nausea and vomiting.  Neurological: Positive for dizziness, vertigo and headaches.  All other systems reviewed and are negative.     Objective:    Physical Exam Vitals reviewed.  Constitutional:      Appearance: Normal appearance.  HENT:  Head: Normocephalic.     Nose: Nose normal.  Eyes:     Conjunctiva/sclera: Conjunctivae normal.  Cardiovascular:     Rate and Rhythm: Normal rate and regular rhythm.     Pulses: Normal pulses.     Heart sounds: Normal heart sounds.  Pulmonary:     Effort: Pulmonary effort is normal.     Breath sounds: Normal breath sounds.  Abdominal:     General: Bowel sounds are normal.  Musculoskeletal:         General: Normal range of motion.  Skin:    General: Skin is warm.  Neurological:     Mental Status: She is alert and oriented to person, place, and time.     Comments: Dizziness, lightheadedness, headache.  Psychiatric:        Mood and Affect: Mood normal.     BP 125/67   Pulse 73   Temp (!) 97.3 F (36.3 C)   Resp 20   Ht 5' 3"  (1.6 m)   Wt 192 lb (87.1 kg)   SpO2 99%   BMI 34.01 kg/m  Wt Readings from Last 3 Encounters:  10/20/20 192 lb (87.1 kg)  08/12/20 191 lb (86.6 kg)  07/22/20 192 lb 3.2 oz (87.2 kg)      Lab Results  Component Value Date   TSH 0.942 08/12/2020   Lab Results  Component Value Date   WBC 7.9 06/16/2020   HGB 13.5 06/16/2020   HCT 43.2 06/16/2020   MCV 84 06/16/2020   PLT 324 06/16/2020   Lab Results  Component Value Date   NA 141 06/16/2020   K 4.7 06/16/2020   CO2 22 06/16/2020   GLUCOSE 96 06/16/2020   BUN 14 06/16/2020   CREATININE 1.19 (H) 06/16/2020   BILITOT 0.3 06/16/2020   ALKPHOS 79 06/16/2020   AST 23 06/16/2020   ALT 16 06/16/2020   PROT 7.0 06/16/2020   ALBUMIN 4.4 06/16/2020   CALCIUM 10.0 06/16/2020   Lab Results  Component Value Date   CHOL 214 (H) 06/16/2020   Lab Results  Component Value Date   HDL 79 06/16/2020   Lab Results  Component Value Date   LDLCALC 114 (H) 06/16/2020   Lab Results  Component Value Date   TRIG 123 06/16/2020   Lab Results  Component Value Date   CHOLHDL 2.7 06/16/2020   No results found for: HGBA1C    Assessment & Plan:   Problem List Items Addressed This Visit      Cardiovascular and Mediastinum   HTN (hypertension)    Well-controlled on current medication no changes necessary.  Continue to provide education to patient with printed handouts given.  Continue healthy diet and exercise regimen as tolerated. Follow-up with worsening or unresolved symptoms.        Other   TSH elevation    Patient continues to report signs and symptoms of hypothyroidism.  Including  fatigue feeling cold cold intolerance but denies palpitations and sweating.  Completed TSH labs pending.   Provided education to patient with printed handouts given.   Follow-up with worsening or unresolved symptoms.      Relevant Orders   Thyroid Panel With TSH   Mixed hyperlipidemia    Patient did not use Crestor 5 mg tablet by mouth daily as instructed.  Labs are drawn today to compare from previous results.  Continue to provide education to patient with printed handouts given.  Labs completed-lipid panel, CBC, CMP, TSH.  Labs pending.  Follow-up  with worsening or unresolved symptoms.         Relevant Orders   CBC with Differential/Platelet   Lipid panel   Dizziness    This is new for patient in the last 1 month.  Patient is reporting increased dizziness, and slight chest pressure, and headache.  This is a one-time occurrence.  Patient denies nausea, visual changes, coughing or vomiting.  Symptoms are aggravated by standing and walking.  She has tried nothing for the symptoms.  Provided education to patient to increase hydration as patient reports she is not drinking enough because of the cold weather.  Started patient on meclizine as needed.  Follow-up with unresolved or worsening symptoms.       Other Visit Diagnoses    Essential hypertension    -  Primary   Relevant Orders   CBC with Differential/Platelet   CMP14+EGFR   Gastroesophageal reflux disease without esophagitis       Relevant Orders   CBC with Differential/Platelet      No orders of the defined types were placed in this encounter.   Follow-up: Return in about 3 months (around 01/20/2021).    Ivy Lynn, NP

## 2020-10-20 NOTE — Assessment & Plan Note (Signed)
Patient did not use Crestor 5 mg tablet by mouth daily as instructed.  Labs are drawn today to compare from previous results.  Continue to provide education to patient with printed handouts given.  Labs completed-lipid panel, CBC, CMP, TSH.  Labs pending.  Follow-up with worsening or unresolved symptoms.

## 2020-10-20 NOTE — Progress Notes (Signed)
dg 

## 2020-10-20 NOTE — Assessment & Plan Note (Signed)
Patient continues to report signs and symptoms of hypothyroidism.  Including fatigue feeling cold cold intolerance but denies palpitations and sweating.  Completed TSH labs pending.   Provided education to patient with printed handouts given.   Follow-up with worsening or unresolved symptoms.

## 2020-10-20 NOTE — Assessment & Plan Note (Signed)
This is new for patient in the last 1 month.  Patient is reporting increased dizziness, and slight chest pressure, and headache.  This is a one-time occurrence.  Patient denies nausea, visual changes, coughing or vomiting.  Symptoms are aggravated by standing and walking.  She has tried nothing for the symptoms.  Provided education to patient to increase hydration as patient reports she is not drinking enough because of the cold weather.  Started patient on meclizine as needed.  Follow-up with unresolved or worsening symptoms.

## 2020-10-20 NOTE — Patient Instructions (Addendum)
Hypertension well controlled on current medication no changes to medication dose necessary.  Completed lipids to recheck cholesterol, will treat appropriately.  Labs pending.  DEXA scan completed.  Follow-up in 3-6 months Hypothyroidism  Hypothyroidism is when the thyroid gland does not make enough of certain hormones (it is underactive). The thyroid gland is a small gland located in the lower front part of the neck, just in front of the windpipe (trachea). This gland makes hormones that help control how the body uses food for energy (metabolism) as well as how the heart and brain function. These hormones also play a role in keeping your bones strong. When the thyroid is underactive, it produces too little of the hormones thyroxine (T4) and triiodothyronine (T3). What are the causes? This condition may be caused by:  Hashimoto's disease. This is a disease in which the body's disease-fighting system (immune system) attacks the thyroid gland. This is the most common cause.  Viral infections.  Pregnancy.  Certain medicines.  Birth defects.  Past radiation treatments to the head or neck for cancer.  Past treatment with radioactive iodine.  Past exposure to radiation in the environment.  Past surgical removal of part or all of the thyroid.  Problems with a gland in the center of the brain (pituitary gland).  Lack of enough iodine in the diet. What increases the risk? You are more likely to develop this condition if:  You are female.  You have a family history of thyroid conditions.  You use a medicine called lithium.  You take medicines that affect the immune system (immunosuppressants). What are the signs or symptoms? Symptoms of this condition include:  Feeling as though you have no energy (lethargy).  Not being able to tolerate cold.  Weight gain that is not explained by a change in diet or exercise habits.  Lack of appetite.  Dry skin.  Coarse hair.  Menstrual  irregularity.  Slowing of thought processes.  Constipation.  Sadness or depression. How is this diagnosed? This condition may be diagnosed based on:  Your symptoms, your medical history, and a physical exam.  Blood tests. You may also have imaging tests, such as an ultrasound or MRI. How is this treated? This condition is treated with medicine that replaces the thyroid hormones that your body does not make. After you begin treatment, it may take several weeks for symptoms to go away. Follow these instructions at home:  Take over-the-counter and prescription medicines only as told by your health care provider.  If you start taking any new medicines, tell your health care provider.  Keep all follow-up visits as told by your health care provider. This is important. ? As your condition improves, your dosage of thyroid hormone medicine may change. ? You will need to have blood tests regularly so that your health care provider can monitor your condition. Contact a health care provider if:  Your symptoms do not get better with treatment.  You are taking thyroid replacement medicine and you: ? Sweat a lot. ? Have tremors. ? Feel anxious. ? Lose weight rapidly. ? Cannot tolerate heat. ? Have emotional swings. ? Have diarrhea. ? Feel weak. Get help right away if you have:  Chest pain.  An irregular heartbeat.  A rapid heartbeat.  Difficulty breathing. Summary  Hypothyroidism is when the thyroid gland does not make enough of certain hormones (it is underactive).  When the thyroid is underactive, it produces too little of the hormones thyroxine (T4) and triiodothyronine (T3).  The  most common cause is Hashimoto's disease, a disease in which the body's disease-fighting system (immune system) attacks the thyroid gland. The condition can also be caused by viral infections, medicine, pregnancy, or past radiation treatment to the head or neck.  Symptoms may include weight gain,  dry skin, constipation, feeling as though you do not have energy, and not being able to tolerate cold.  This condition is treated with medicine to replace the thyroid hormones that your body does not make. This information is not intended to replace advice given to you by your health care provider. Make sure you discuss any questions you have with your health care provider. Document Revised: 11/03/2017 Document Reviewed: 11/01/2017 Elsevier Patient Education  [place].   Cholesterol Content in Foods Cholesterol is a waxy, fat-like substance that helps to carry fat in the blood. The body needs cholesterol in small amounts, but too much cholesterol can cause damage to the arteries and heart. Most people should eat less than 200 milligrams (mg) of cholesterol a day. Foods with cholesterol  Cholesterol is found in animal-based foods, such as meat, seafood, and dairy. Generally, low-fat dairy and lean meats have less cholesterol than full-fat dairy and fatty meats. The milligrams of cholesterol per serving (mg per serving) of common cholesterol-containing foods are listed below. Meat and other proteins  Egg -- one large whole egg has 186 mg.  Veal shank -- 4 oz has 141 mg.  Lean ground [place] (93% lean) -- 4 oz has 118 mg.  Fat-trimmed lamb loin -- 4 oz has 106 mg.  Lean ground beef (90% lean) -- 4 oz has 100 mg.  Lobster -- 3.5 oz has 90 mg.  Pork loin chops -- 4 oz has 86 mg.  Canned salmon -- 3.5 oz has 83 mg.  Fat-trimmed beef top loin -- 4 oz has 78 mg.  Frankfurter -- 1 frank (3.5 oz) has 77 mg.  Crab -- 3.5 oz has 71 mg.  Roasted chicken without skin, white meat -- 4 oz has 66 mg.  Light bologna -- 2 oz has 45 mg.  Deli-cut [place] -- 2 oz has 31 mg.  Canned tuna -- 3.5 oz has 31 mg.  Berniece Salines -- 1 oz has 29 mg.  Oysters and mussels (raw) -- 3.5 oz has 25 mg.  Mackerel -- 1 oz has 22 mg.  Trout -- 1 oz has 20 mg.  Pork sausage -- 1 link (1 oz) has 17  mg.  Salmon -- 1 oz has 16 mg.  Tilapia -- 1 oz has 14 mg. Dairy  Soft-serve ice cream --  cup (4 oz) has 103 mg.  Whole-milk yogurt -- 1 cup (8 oz) has 29 mg.  Cheddar cheese -- 1 oz has 28 mg.  American cheese -- 1 oz has 28 mg.  Whole milk -- 1 cup (8 oz) has 23 mg.  2% milk -- 1 cup (8 oz) has 18 mg.  Cream cheese -- 1 tablespoon (Tbsp) has 15 mg.  Cottage cheese --  cup (4 oz) has 14 mg.  Low-fat (1%) milk -- 1 cup (8 oz) has 10 mg.  Sour cream -- 1 Tbsp has 8.5 mg.  Low-fat yogurt -- 1 cup (8 oz) has 8 mg.  Nonfat Greek yogurt -- 1 cup (8 oz) has 7 mg.  Half-and-half cream -- 1 Tbsp has 5 mg. Fats and oils  Cod liver oil -- 1 tablespoon (Tbsp) has 82 mg.  Butter -- 1 Tbsp has 15 mg.  Lard -- 1 Tbsp has 14 mg.  Bacon grease -- 1 Tbsp has 14 mg.  Mayonnaise -- 1 Tbsp has 5-10 mg.  Margarine -- 1 Tbsp has 3-10 mg. Exact amounts of cholesterol in these foods may vary depending on specific ingredients and brands. Foods without cholesterol Most plant-based foods do not have cholesterol unless you combine them with a food that has cholesterol. Foods without cholesterol include:  Grains and cereals.  Vegetables.  Fruits.  Vegetable oils, such as olive, canola, and sunflower oil.  Legumes, such as peas, beans, and lentils.  Nuts and seeds.  Egg whites. Summary  The body needs cholesterol in small amounts, but too much cholesterol can cause damage to the arteries and heart.  Most people should eat less than 200 milligrams (mg) of cholesterol a day. This information is not intended to replace advice given to you by your health care provider. Make sure you discuss any questions you have with your health care provider. Document Revised: 11/03/2017 Document Reviewed: 07/18/2017 Elsevier Patient Education  [place].  Dizziness Dizziness is a common problem. It makes you feel unsteady or light-headed. You may feel like you are about to pass  out (faint). Dizziness can lead to getting hurt if you stumble or fall. Dizziness can be caused by many things, including: Medicines. Not having enough water in your body (dehydration). Illness. Follow these instructions at home: Eating and drinking  Drink enough fluid to keep your pee (urine) clear or pale yellow. This helps to keep you from getting dehydrated. Try to drink more clear fluids, such as water. Do not drink alcohol. Limit how much caffeine you drink or eat, if your doctor tells you to do that. Limit how much salt (sodium) you drink or eat, if your doctor tells you to do that. Activity  Avoid making quick movements. When you stand up from sitting in a chair, steady yourself until you feel okay. In the morning, first sit up on the side of the bed. When you feel okay, stand slowly while you hold onto something. Do this until you know that your balance is fine. If you need to stand in one place for a long time, move your legs often. Tighten and relax the muscles in your legs while you are standing. Do not drive or use heavy machinery if you feel dizzy. Avoid bending down if you feel dizzy. Place items in your home so you can reach them easily without leaning over. Lifestyle Do not use any products that contain nicotine or tobacco, such as cigarettes and e-cigarettes. If you need help quitting, ask your doctor. Try to lower your stress level. You can do this by using methods such as yoga or meditation. Talk with your doctor if you need help. General instructions Watch your dizziness for any changes. Take over-the-counter and prescription medicines only as told by your doctor. Talk with your doctor if you think that you are dizzy because of a medicine that you are taking. Tell a friend or a family member that you are feeling dizzy. If he or she notices any changes in your behavior, have this person call your doctor. Keep all follow-up visits as told by your doctor. This is  important. Contact a doctor if: Your dizziness does not go away. Your dizziness or light-headedness gets worse. You feel sick to your stomach (nauseous). You have trouble hearing. You have new symptoms. You are unsteady on your feet. You feel like the room is spinning. Get help  right away if: You throw up (vomit) or have watery poop (diarrhea), and you cannot eat or drink anything. You have trouble: Talking. Walking. Swallowing. Using your arms, hands, or legs. You feel generally weak. You are not thinking clearly, or you have trouble forming sentences. A friend or family member may notice this. You have: Chest pain. Pain in your belly (abdomen). Shortness of breath. Sweating. Your vision changes. You are bleeding. You have a very bad headache. You have neck pain or a stiff neck. You have a fever. These symptoms may be an emergency. Do not wait to see if the symptoms will go away. Get medical help right away. Call your local emergency services (911 in the [place] Do not drive yourself to the hospital. Summary Dizziness makes you feel unsteady or light-headed. You may feel like you are about to pass out (faint). Drink enough fluid to keep your pee (urine) clear or pale yellow. Do not drink alcohol. Avoid making quick movements if you feel dizzy. Watch your dizziness for any changes. This information is not intended to replace advice given to you by your health care provider. Make sure you discuss any questions you have with your health care provider. Document Revised: 11/24/2017 Document Reviewed: 12/08/2016 Elsevier Patient Education  [place].

## 2020-10-21 ENCOUNTER — Other Ambulatory Visit: Payer: Self-pay | Admitting: Nurse Practitioner

## 2020-10-21 LAB — CBC WITH DIFFERENTIAL/PLATELET
Basophils Absolute: 0.1 10*3/uL (ref 0.0–0.2)
Basos: 1 %
EOS (ABSOLUTE): 0.1 10*3/uL (ref 0.0–0.4)
Eos: 1 %
Hematocrit: 39.8 % (ref 34.0–46.6)
Hemoglobin: 12.5 g/dL (ref 11.1–15.9)
Immature Grans (Abs): 0 10*3/uL (ref 0.0–0.1)
Immature Granulocytes: 0 %
Lymphocytes Absolute: 1.8 10*3/uL (ref 0.7–3.1)
Lymphs: 26 %
MCH: 25.9 pg — ABNORMAL LOW (ref 26.6–33.0)
MCHC: 31.4 g/dL — ABNORMAL LOW (ref 31.5–35.7)
MCV: 82 fL (ref 79–97)
Monocytes Absolute: 0.6 10*3/uL (ref 0.1–0.9)
Monocytes: 8 %
Neutrophils Absolute: 4.4 10*3/uL (ref 1.4–7.0)
Neutrophils: 64 %
Platelets: 245 10*3/uL (ref 150–450)
RBC: 4.83 x10E6/uL (ref 3.77–5.28)
RDW: 12.9 % (ref 11.7–15.4)
WBC: 6.9 10*3/uL (ref 3.4–10.8)

## 2020-10-21 LAB — LIPID PANEL
Chol/HDL Ratio: 2.6 ratio (ref 0.0–4.4)
Cholesterol, Total: 187 mg/dL (ref 100–199)
HDL: 73 mg/dL (ref >39)
LDL Chol Calc (NIH): 98 mg/dL (ref 0–99)
Triglycerides: 92 mg/dL (ref 0–149)
VLDL Cholesterol Cal: 16 mg/dL (ref 5–40)

## 2020-10-21 LAB — CMP14+EGFR
ALT: 13 IU/L (ref 0–32)
AST: 20 IU/L (ref 0–40)
Albumin/Globulin Ratio: 1.6 (ref 1.2–2.2)
Albumin: 4.1 g/dL (ref 3.7–4.7)
Alkaline Phosphatase: 75 IU/L (ref 44–121)
BUN/Creatinine Ratio: 17 (ref 12–28)
BUN: 22 mg/dL (ref 8–27)
Bilirubin Total: 0.3 mg/dL (ref 0.0–1.2)
CO2: 24 mmol/L (ref 20–29)
Calcium: 9.7 mg/dL (ref 8.7–10.3)
Chloride: 103 mmol/L (ref 96–106)
Creatinine, Ser: 1.31 mg/dL — ABNORMAL HIGH (ref 0.57–1.00)
GFR calc Af Amer: 46 mL/min/{1.73_m2} — ABNORMAL LOW (ref >59)
GFR calc non Af Amer: 40 mL/min/{1.73_m2} — ABNORMAL LOW (ref >59)
Globulin, Total: 2.5 g/dL (ref 1.5–4.5)
Glucose: 95 mg/dL (ref 65–99)
Potassium: 4.4 mmol/L (ref 3.5–5.2)
Sodium: 142 mmol/L (ref 134–144)
Total Protein: 6.6 g/dL (ref 6.0–8.5)

## 2020-10-21 LAB — THYROID PANEL WITH TSH
Free Thyroxine Index: 2.6 (ref 1.2–4.9)
T3 Uptake Ratio: 24 % (ref 24–39)
T4, Total: 10.7 ug/dL (ref 4.5–12.0)
TSH: 0.33 u[IU]/mL — ABNORMAL LOW (ref 0.450–4.500)

## 2020-10-21 MED ORDER — LEVOTHYROXINE SODIUM 25 MCG PO TABS
25.0000 ug | ORAL_TABLET | Freq: Every day | ORAL | 0 refills | Status: DC
Start: 2020-10-21 — End: 2021-01-20

## 2020-10-23 ENCOUNTER — Telehealth: Payer: Self-pay

## 2020-10-23 NOTE — Telephone Encounter (Signed)
Pt returned missed call regarding lab results and Dexa results. Reviewed both results with pt per Je's notes. Pt voiced understanding.

## 2020-11-02 ENCOUNTER — Other Ambulatory Visit: Payer: Self-pay | Admitting: *Deleted

## 2020-11-02 DIAGNOSIS — R42 Dizziness and giddiness: Secondary | ICD-10-CM

## 2020-11-03 MED ORDER — MECLIZINE HCL 25 MG PO TABS: 25.0000 mg | ORAL_TABLET | Freq: Three times a day (TID) | ORAL | 0 refills | Status: DC | PRN

## 2020-11-23 ENCOUNTER — Other Ambulatory Visit: Payer: Self-pay | Admitting: *Deleted

## 2020-11-23 ENCOUNTER — Other Ambulatory Visit: Payer: Self-pay | Admitting: Family Medicine

## 2020-11-23 DIAGNOSIS — R42 Dizziness and giddiness: Secondary | ICD-10-CM

## 2020-11-23 DIAGNOSIS — G629 Polyneuropathy, unspecified: Secondary | ICD-10-CM

## 2020-11-23 MED ORDER — MECLIZINE HCL 25 MG PO TABS: 25.0000 mg | ORAL_TABLET | Freq: Three times a day (TID) | ORAL | 0 refills | Status: DC | PRN

## 2020-12-23 ENCOUNTER — Other Ambulatory Visit: Payer: Self-pay | Admitting: Family Medicine

## 2020-12-23 DIAGNOSIS — G629 Polyneuropathy, unspecified: Secondary | ICD-10-CM

## 2020-12-23 DIAGNOSIS — R42 Dizziness and giddiness: Secondary | ICD-10-CM

## 2021-01-20 ENCOUNTER — Other Ambulatory Visit: Payer: Self-pay | Admitting: *Deleted

## 2021-01-20 MED ORDER — LEVOTHYROXINE SODIUM 25 MCG PO TABS: 25.0000 ug | ORAL_TABLET | Freq: Every day | ORAL | 0 refills | Status: DC

## 2021-02-16 ENCOUNTER — Encounter: Payer: Self-pay | Admitting: Family Medicine

## 2021-02-16 ENCOUNTER — Other Ambulatory Visit: Payer: Self-pay

## 2021-02-16 ENCOUNTER — Ambulatory Visit: Payer: Medicare PPO | Admitting: Family Medicine

## 2021-02-16 VITALS — BP 125/73 | HR 67 | Temp 97.4°F | Ht 63.0 in | Wt 192.6 lb

## 2021-02-16 DIAGNOSIS — G629 Polyneuropathy, unspecified: Secondary | ICD-10-CM | POA: Diagnosis not present

## 2021-02-16 DIAGNOSIS — Z0000 Encounter for general adult medical examination without abnormal findings: Secondary | ICD-10-CM

## 2021-02-16 DIAGNOSIS — Z79899 Other long term (current) drug therapy: Secondary | ICD-10-CM

## 2021-02-16 DIAGNOSIS — E039 Hypothyroidism, unspecified: Secondary | ICD-10-CM | POA: Insufficient documentation

## 2021-02-16 DIAGNOSIS — I10 Essential (primary) hypertension: Secondary | ICD-10-CM | POA: Diagnosis not present

## 2021-02-16 DIAGNOSIS — Z1211 Encounter for screening for malignant neoplasm of colon: Secondary | ICD-10-CM | POA: Diagnosis not present

## 2021-02-16 DIAGNOSIS — E782 Mixed hyperlipidemia: Secondary | ICD-10-CM | POA: Diagnosis not present

## 2021-02-16 DIAGNOSIS — R944 Abnormal results of kidney function studies: Secondary | ICD-10-CM | POA: Diagnosis not present

## 2021-02-16 DIAGNOSIS — K219 Gastro-esophageal reflux disease without esophagitis: Secondary | ICD-10-CM | POA: Diagnosis not present

## 2021-02-16 DIAGNOSIS — R42 Dizziness and giddiness: Secondary | ICD-10-CM

## 2021-02-16 HISTORY — DX: Gastro-esophageal reflux disease without esophagitis: K21.9

## 2021-02-16 HISTORY — DX: Hypothyroidism, unspecified: E03.9

## 2021-02-16 MED ORDER — PREGABALIN 75 MG PO CAPS: 75.0000 mg | ORAL_CAPSULE | Freq: Two times a day (BID) | ORAL | 5 refills | Status: DC

## 2021-02-16 MED ORDER — MECLIZINE HCL 25 MG PO TABS
ORAL_TABLET | ORAL | 1 refills | Status: DC
Start: 2021-02-16 — End: 2021-04-28

## 2021-02-16 MED ORDER — ROSUVASTATIN CALCIUM 5 MG PO TABS
5.0000 mg | ORAL_TABLET | Freq: Every day | ORAL | 1 refills | Status: DC
Start: 2021-02-16 — End: 2021-04-28

## 2021-02-16 MED ORDER — LEVOTHYROXINE SODIUM 25 MCG PO TABS: 25.0000 ug | ORAL_TABLET | Freq: Every day | ORAL | 1 refills | Status: DC

## 2021-02-16 MED ORDER — PANTOPRAZOLE SODIUM 40 MG PO TBEC: 40.0000 mg | DELAYED_RELEASE_TABLET | Freq: Every day | ORAL | 3 refills | Status: DC

## 2021-02-16 MED ORDER — VALSARTAN-HYDROCHLOROTHIAZIDE 80-12.5 MG PO TABS: 1.0000 | ORAL_TABLET | Freq: Every day | ORAL | 1 refills | Status: DC

## 2021-02-16 NOTE — Progress Notes (Signed)
Assessment & Plan:  1. Essential hypertension Well controlled on current regimen.  - valsartan-hydrochlorothiazide (DIOVAN HCT) 80-12.5 MG tablet; Take 1 tablet by mouth daily.  Dispense: 90 tablet; Refill: 1 - CMP14+EGFR  2. Mixed hyperlipidemia Well controlled on current regimen.  - rosuvastatin (CRESTOR) 5 MG tablet; Take 1 tablet (5 mg total) by mouth daily.  Dispense: 90 tablet; Refill: 1 - CMP14+EGFR  3. Gastroesophageal reflux disease without esophagitis Well controlled on current regimen.  - pantoprazole (PROTONIX) 40 MG tablet; Take 1 tablet (40 mg total) by mouth daily.  Dispense: 90 tablet; Refill: 3 - CMP14+EGFR  4. Neuropathy Well controlled on current regimen.  - pregabalin (LYRICA) 75 MG capsule; Take 1 capsule (75 mg total) by mouth 2 (two) times daily.  Dispense: 60 capsule; Refill: 5 - CMP14+EGFR - ToxASSURE Select 13 (MW), Urine  5. Controlled substance agreement signed Controlled substance agreement signed today.  Urine drug screen collected today.  PDMP reviewed with no concerning findings. - ToxASSURE Select 13 (MW), Urine  6. Dizziness Well controlled on current regimen.  - meclizine (ANTIVERT) 25 MG tablet; TAKE 1 TABLET BY MOUTH THREE TIMES DAILY AS NEEDED FOR DIZZINESS  Dispense: 180 tablet; Refill: 1  7. Acquired hypothyroidism Dosage adjusted after last labs over 3 months ago.  Labs today to assess - levothyroxine (SYNTHROID) 25 MCG tablet; Take 1 tablet (25 mcg total) by mouth daily before breakfast.  Dispense: 90 tablet; Refill: 1 - CMP14+EGFR - Thyroid Panel With TSH  8. Decreased GFR Labs to assess. - CMP14+EGFR  9. Colon cancer screening - Ambulatory referral to Gastroenterology  10. Healthcare maintenance Records request for mammogram from [facility].   Return in about 6 months (around 08/19/2021) for follow-up of chronic medication conditions.  Hendricks Limes, MSN, APRN, FNP-C [facility]  Subjective:     Patient ID: [name] [name], female    DOB: 11-16-1946, 75 y.o.   MRN: [id number]  Patient Care Team: Loman Brooklyn, FNP as PCP - General (Family Medicine)   Chief Complaint:  Chief Complaint  Patient presents with  . Hypertension  . Hyperlipidemia    Check up of chronic medical conditions     HPI: [name] [name] is a 75 y.o. female presenting on 02/16/2021 for Hypertension and Hyperlipidemia (Check up of chronic medical conditions )  Patient reports she is sometimes needing meclizine twice daily for dizziness.  New complaints: None  Social history:  Relevant past medical, surgical, family and social history reviewed and updated as indicated. Interim medical history since our last visit reviewed.  Allergies and medications reviewed and updated.  DATA REVIEWED: CHART IN EPIC  ROS: Negative unless specifically indicated above in HPI.    Current Outpatient Medications:  .  Cholecalciferol (VITAMIN D3) 5000 units CAPS, Take by mouth., Disp: , Rfl:  .  levothyroxine (SYNTHROID) 25 MCG tablet, Take 1 tablet (25 mcg total) by mouth daily before breakfast. (Needs to be seen before next refill), Disp: 30 tablet, Rfl: 0 .  loratadine (CLARITIN) 10 MG tablet, Take 1 tablet (10 mg total) by mouth daily., Disp: 30 tablet, Rfl: 0 .  meclizine (ANTIVERT) 25 MG tablet, TAKE 1 TABLET BY MOUTH THREE TIMES DAILY AS NEEDED FOR DIZZINESS, Disp: 30 tablet, Rfl: 2 .  pantoprazole (PROTONIX) 40 MG tablet, Take 1 tablet (40 mg total) by mouth daily., Disp: 90 tablet, Rfl: 3 .  POTASSIUM PO, Take by mouth daily., Disp: , Rfl:  .  pregabalin (LYRICA) 75 MG capsule, Take  1 capsule by mouth twice daily, Disp: 60 capsule, Rfl: 2 .  rosuvastatin (CRESTOR) 5 MG tablet, Take 1 tablet (5 mg total) by mouth daily., Disp: 90 tablet, Rfl: 1 .  valsartan-hydrochlorothiazide (DIOVAN HCT) 80-12.5 MG tablet, Take 1 tablet by mouth daily., Disp: 90 tablet, Rfl: 3   Allergies  Allergen Reactions  . Augmentin  [Amoxicillin-Pot Clavulanate] Other (See Comments)    Facial swelling   Past Medical History:  Diagnosis Date  . Allergic rhinitis   . HTN (hypertension)     Past Surgical History:  Procedure Laterality Date  . CARPAL TUNNEL RELEASE     RIGHT  . PROSTATE SURGERY    . TOTAL ABDOMINAL HYSTERECTOMY W/ BILATERAL SALPINGOOPHORECTOMY      Social History   Socioeconomic History  . Marital status: Divorced    Spouse name: Not on file  . Number of children: 2  . Years of education: Not on file  . Highest education level: 10th grade  Occupational History  . Occupation: Retired    Comment: retired from [employer] work  Tobacco Use  . Smoking status: Never Smoker  . Smokeless tobacco: Never Used  Vaping Use  . Vaping Use: Never used  Substance and Sexual Activity  . Alcohol use: No  . Drug use: No  . Sexual activity: Not Currently  Other Topics Concern  . Not on file  Social History Narrative   Has 3 children   Social Determinants of Health   Financial Resource Strain: Not on file  Food Insecurity: Not on file  Transportation Needs: Not on file  Physical Activity: Not on file  Stress: Not on file  Social Connections: Not on file  Intimate Partner Violence: Not on file        Objective:    BP 125/73   Pulse 67   Temp (!) 97.4 F (36.3 C) (Temporal)   Ht $R'5\' 3"'dD$  (1.6 m)   Wt 192 lb 9.6 oz (87.4 kg)   SpO2 98%   BMI 34.12 kg/m   Wt Readings from Last 3 Encounters:  02/16/21 192 lb 9.6 oz (87.4 kg)  10/20/20 192 lb (87.1 kg)  08/12/20 191 lb (86.6 kg)    Physical Exam Vitals reviewed.  Constitutional:      General: She is not in acute distress.    Appearance: Normal appearance. She is obese. She is not ill-appearing, toxic-appearing or diaphoretic.  HENT:     Head: Normocephalic and atraumatic.  Eyes:     General: No scleral icterus.       Right eye: No discharge.        Left eye: No discharge.     Conjunctiva/sclera: Conjunctivae normal.   Cardiovascular:     Rate and Rhythm: Normal rate and regular rhythm.     Heart sounds: Normal heart sounds. No murmur heard. No friction rub. No gallop.   Pulmonary:     Effort: Pulmonary effort is normal. No respiratory distress.     Breath sounds: Normal breath sounds. No stridor. No wheezing, rhonchi or rales.  Musculoskeletal:        General: Normal range of motion.     Cervical back: Normal range of motion.  Skin:    General: Skin is warm and dry.     Capillary Refill: Capillary refill takes less than 2 seconds.  Neurological:     General: No focal deficit present.     Mental Status: She is alert and oriented to person, place, and time. Mental  status is at baseline.  Psychiatric:        Mood and Affect: Mood normal.        Behavior: Behavior normal.        Thought Content: Thought content normal.        Judgment: Judgment normal.     Lab Results  Component Value Date   TSH 0.330 (L) 10/20/2020   Lab Results  Component Value Date   WBC 6.9 10/20/2020   HGB 12.5 10/20/2020   HCT 39.8 10/20/2020   MCV 82 10/20/2020   PLT 245 10/20/2020   Lab Results  Component Value Date   NA 142 10/20/2020   K 4.4 10/20/2020   CO2 24 10/20/2020   GLUCOSE 95 10/20/2020   BUN 22 10/20/2020   CREATININE 1.31 (H) 10/20/2020   BILITOT 0.3 10/20/2020   ALKPHOS 75 10/20/2020   AST 20 10/20/2020   ALT 13 10/20/2020   PROT 6.6 10/20/2020   ALBUMIN 4.1 10/20/2020   CALCIUM 9.7 10/20/2020   Lab Results  Component Value Date   CHOL 187 10/20/2020   Lab Results  Component Value Date   HDL 73 10/20/2020   Lab Results  Component Value Date   LDLCALC 98 10/20/2020   Lab Results  Component Value Date   TRIG 92 10/20/2020   Lab Results  Component Value Date   CHOLHDL 2.6 10/20/2020   No results found for: HGBA1C

## 2021-02-17 LAB — CMP14+EGFR
ALT: 13 IU/L (ref 0–32)
AST: 24 IU/L (ref 0–40)
Albumin/Globulin Ratio: 1.7 (ref 1.2–2.2)
Albumin: 4.6 g/dL (ref 3.7–4.7)
Alkaline Phosphatase: 75 IU/L (ref 44–121)
BUN/Creatinine Ratio: 14 (ref 12–28)
BUN: 17 mg/dL (ref 8–27)
Bilirubin Total: 0.3 mg/dL (ref 0.0–1.2)
CO2: 25 mmol/L (ref 20–29)
Calcium: 10 mg/dL (ref 8.7–10.3)
Chloride: 102 mmol/L (ref 96–106)
Creatinine, Ser: 1.2 mg/dL — ABNORMAL HIGH (ref 0.57–1.00)
Globulin, Total: 2.7 g/dL (ref 1.5–4.5)
Glucose: 90 mg/dL (ref 65–99)
Potassium: 4.4 mmol/L (ref 3.5–5.2)
Sodium: 141 mmol/L (ref 134–144)
Total Protein: 7.3 g/dL (ref 6.0–8.5)
eGFR: 47 mL/min/{1.73_m2} — ABNORMAL LOW (ref >59)

## 2021-02-17 LAB — THYROID PANEL WITH TSH
Free Thyroxine Index: 2.2 (ref 1.2–4.9)
T3 Uptake Ratio: 23 % — ABNORMAL LOW (ref 24–39)
T4, Total: 9.7 ug/dL (ref 4.5–12.0)
TSH: 4.13 u[IU]/mL (ref 0.450–4.500)

## 2021-02-25 LAB — TOXASSURE SELECT 13 (MW), URINE

## 2021-03-11 DIAGNOSIS — Z1211 Encounter for screening for malignant neoplasm of colon: Secondary | ICD-10-CM | POA: Diagnosis not present

## 2021-04-09 DIAGNOSIS — K5520 Angiodysplasia of colon without hemorrhage: Secondary | ICD-10-CM | POA: Diagnosis not present

## 2021-04-09 DIAGNOSIS — D125 Benign neoplasm of sigmoid colon: Secondary | ICD-10-CM | POA: Diagnosis not present

## 2021-04-09 DIAGNOSIS — E039 Hypothyroidism, unspecified: Secondary | ICD-10-CM | POA: Diagnosis not present

## 2021-04-09 DIAGNOSIS — K5730 Diverticulosis of large intestine without perforation or abscess without bleeding: Secondary | ICD-10-CM | POA: Diagnosis not present

## 2021-04-09 DIAGNOSIS — Z1211 Encounter for screening for malignant neoplasm of colon: Secondary | ICD-10-CM | POA: Diagnosis not present

## 2021-04-09 DIAGNOSIS — I10 Essential (primary) hypertension: Secondary | ICD-10-CM | POA: Diagnosis not present

## 2021-04-09 DIAGNOSIS — K620 Anal polyp: Secondary | ICD-10-CM | POA: Diagnosis not present

## 2021-04-22 DIAGNOSIS — D125 Benign neoplasm of sigmoid colon: Secondary | ICD-10-CM | POA: Diagnosis not present

## 2021-04-26 ENCOUNTER — Other Ambulatory Visit: Payer: Self-pay

## 2021-04-26 ENCOUNTER — Ambulatory Visit: Payer: Medicare PPO | Admitting: Nurse Practitioner

## 2021-04-26 ENCOUNTER — Encounter: Payer: Self-pay | Admitting: Nurse Practitioner

## 2021-04-26 VITALS — BP 137/85 | HR 76 | Temp 97.1°F | Ht 63.0 in | Wt 196.0 lb

## 2021-04-26 DIAGNOSIS — W57XXXA Bitten or stung by nonvenomous insect and other nonvenomous arthropods, initial encounter: Secondary | ICD-10-CM | POA: Diagnosis not present

## 2021-04-26 DIAGNOSIS — S70369A Insect bite (nonvenomous), unspecified thigh, initial encounter: Secondary | ICD-10-CM

## 2021-04-26 MED ORDER — DOXYCYCLINE HYCLATE 100 MG PO TABS: 100.0000 mg | ORAL_TABLET | Freq: Two times a day (BID) | ORAL | 0 refills | Status: DC

## 2021-04-26 NOTE — Patient Instructions (Signed)
Tick Bite Information, Adult  Ticks are insects that can bite. Most ticks live in shrubs and grassy areas. They climb onto people and animals that go by. Then they bite. Some ticks carry germs that can make you sick. How can I prevent tick bites? Take these steps: Use insect repellent  Use an insect repellent that has 20% or higher of the ingredients DEET, picaridin, or IR3535. Follow the instructions on the label. Put it on: ? Bare skin. ? The tops of your boots. ? Your pant legs. ? The ends of your sleeves.  If you use an insect repellent that has the ingredient permethrin, follow the instructions on the label. Put it on: ? Clothing. ? Boots. ? Supplies or outdoor gear. ? Tents. When you are outside  Wear long sleeves and long pants.  Wear light-colored clothes.  Tuck your pant legs into your socks.  Stay in the middle of the trail. Do not touch the bushes.  Avoid walking through long grass.  Check for ticks on your clothes, hair, and skin often while you are outside. Before going inside your house, check your clothes, skin, head, neck, armpits, waist, groin, and joint areas. When you go indoors  Check your clothes for ticks. Dry your clothes in a dryer on high heat for 10 minutes or more. If clothes are damp, additional time may be needed.  Wash your clothes right away if they need to be washed. Use hot water.  Check your pets and outdoor gear.  Shower right away.  Check your body for ticks. Do a full body check using a mirror. What is the right way to remove a tick? Remove the tick from your skin as soon as possible. Do not remove the tick with your bare fingers.  To remove a tick that is crawling on your skin: ? Go outdoors and brush the tick off. ? Use tape or a lint roller.  To remove a tick that is biting: 1. Wash your hands. 2. If you have latex gloves, put them on. 3. Use tweezers, curved forceps, or a tick-removal tool to grasp the tick. Grasp the tick  as close to your skin and as close to the tick's head as possible. 4. Gently pull up until the tick lets go.  Try to keep the tick's head attached to its body.  Do not twist or jerk the tick.  Do not squeeze or crush the tick. Do not try to remove a tick with heat, alcohol, petroleum jelly, or fingernail polish.   What should I do after taking out a tick?  Throw away the tick. Do not crush a tick with your fingers.  Clean the bite area and your hands with soap and water, rubbing alcohol, or an iodine wash.  If an antiseptic cream or ointment is available, apply a small amount to the bite area.  Wash and disinfect any instruments that you used to remove the tick. How should I get rid of a live tick? To dispose of a live tick, use one of these methods:  Place the tick in rubbing alcohol.  Place the tick in a bag or container you can close tightly.  Wrap the tick tightly in tape.  Flush the tick down the toilet. Contact a doctor if:  You have symptoms, such as: ? A fever or chills. ? A red rash that makes a circle (bull's-eye rash) in the bite area. ? Redness and swelling where the tick bit you. ? Headache. ?  Pain in a muscle, joint, or bone. ? Being more tired than normal. ? Trouble walking or moving your legs. ? Numbness in your legs. ? Tender and swollen lymph glands.  A part of a tick breaks off and gets stuck in your skin. Get help right away if:  You cannot remove a tick.  You cannot move (have paralysis) or feel weak.  You are feeling worse or have new symptoms.  You find a tick that is biting you and filled with blood. This is important if you are in an area where diseases from ticks are common. Summary  Ticks may carry germs that can make you sick.  To prevent tick bites wear long sleeves, long pants, and light colors. Use insect repellent. Follow the instructions on the label.  If the tick is biting, do not try to remove it with heat, alcohol, petroleum  jelly, or fingernail polish.  Use tweezers, curved forceps, or a tick-removal tool to grasp the tick. Gently pull up until the tick lets go. Do not twist or jerk the tick. Do not squeeze or crush the tick.  If you have symptoms, contact a doctor. This information is not intended to replace advice given to you by your health care provider. Make sure you discuss any questions you have with your health care provider. Document Revised: 11/18/2019 Document Reviewed: 11/18/2019 Elsevier Patient Education  2021 [employer].

## 2021-04-26 NOTE — Assessment & Plan Note (Signed)
red, itchy skin of the left upper back thigh  From tick bite in  the last 24 -48 hours. Prophylaxis given to patient 2 tablets of doxycycline 100 mg by mouth.  Advised patient to follow-up with flulike symptoms.  Return in 2 to 3 weeks for Lyme ABS Western blot test.  Patient verbalized understanding  Rx sent to pharmacy  Follow-up with worsening unresolved symptoms.

## 2021-04-26 NOTE — Progress Notes (Signed)
Acute Office Visit  Subjective:    Patient ID: [name] [name], female    DOB: 15-Oct-1946, 75 y.o.   MRN: [id number]  Chief Complaint  Patient presents with  . Tick Removal    HPI Patient is in today for the last 3 days.  Patient is unable to tell how long tick was attached to skin.  Patient was able to remove with alcohol swab, and applied Neosporin   Patient is reporting redness and mild pain with increased itching.  She denies fever, chills, body aches and headache.  Past Medical History:  Diagnosis Date  . Allergic rhinitis   . HTN (hypertension)   . Neuropathy 06/16/2020    Past Surgical History:  Procedure Laterality Date  . CARPAL TUNNEL RELEASE     RIGHT  . PROSTATE SURGERY    . TOTAL ABDOMINAL HYSTERECTOMY W/ BILATERAL SALPINGOOPHORECTOMY      Family History  Problem Relation Age of Onset  . Heart failure Mother        had pacemaker  . COPD Mother     Social History   Socioeconomic History  . Marital status: Divorced    Spouse name: Not on file  . Number of children: 2  . Years of education: Not on file  . Highest education level: 10th grade  Occupational History  . Occupation: Retired    Comment: retired from [employer] work  Tobacco Use  . Smoking status: Never Smoker  . Smokeless tobacco: Never Used  Vaping Use  . Vaping Use: Never used  Substance and Sexual Activity  . Alcohol use: No  . Drug use: No  . Sexual activity: Not Currently  Other Topics Concern  . Not on file  Social History Narrative   Has 3 children   Social Determinants of Health   Financial Resource Strain: Not on file  Food Insecurity: Not on file  Transportation Needs: Not on file  Physical Activity: Not on file  Stress: Not on file  Social Connections: Not on file  Intimate Partner Violence: Not on file    Outpatient Medications Prior to Visit  Medication Sig Dispense Refill  . Cholecalciferol (VITAMIN D3) 5000 units CAPS Take by mouth.    . levothyroxine  (SYNTHROID) 25 MCG tablet Take 1 tablet (25 mcg total) by mouth daily before breakfast. 90 tablet 1  . loratadine (CLARITIN) 10 MG tablet Take 1 tablet (10 mg total) by mouth daily. 30 tablet 0  . meclizine (ANTIVERT) 25 MG tablet TAKE 1 TABLET BY MOUTH THREE TIMES DAILY AS NEEDED FOR DIZZINESS 180 tablet 1  . pantoprazole (PROTONIX) 40 MG tablet Take 1 tablet (40 mg total) by mouth daily. 90 tablet 3  . POTASSIUM PO Take by mouth daily.    . pregabalin (LYRICA) 75 MG capsule Take 1 capsule (75 mg total) by mouth 2 (two) times daily. 60 capsule 5  . rosuvastatin (CRESTOR) 5 MG tablet Take 1 tablet (5 mg total) by mouth daily. 90 tablet 1  . valsartan-hydrochlorothiazide (DIOVAN HCT) 80-12.5 MG tablet Take 1 tablet by mouth daily. 90 tablet 1   No facility-administered medications prior to visit.    Allergies  Allergen Reactions  . Augmentin [Amoxicillin-Pot Clavulanate] Other (See Comments)    Facial swelling    Review of Systems  Constitutional: Negative.   HENT: Negative.   Eyes: Negative.   Respiratory: Negative.   Cardiovascular: Negative.   Gastrointestinal: Negative.   Skin: Positive for color change and rash.  Neurological: Negative.  All other systems reviewed and are negative.      Objective:    Physical Exam Vitals and nursing note reviewed.  Constitutional:      Appearance: Normal appearance.  HENT:     Head: Normocephalic.     Nose: Nose normal.  Eyes:     Conjunctiva/sclera: Conjunctivae normal.  Cardiovascular:     Rate and Rhythm: Normal rate and regular rhythm.     Pulses: Normal pulses.     Heart sounds: Normal heart sounds.  Pulmonary:     Effort: Pulmonary effort is normal.     Breath sounds: Normal breath sounds.  Abdominal:     General: Bowel sounds are normal.  Skin:    Findings: Erythema and rash present.          Comments: Erythema, rash from tick bite   Neurological:     Mental Status: She is alert and oriented to person, place, and  time.  Psychiatric:        Behavior: Behavior normal.     BP 137/85   Pulse 76   Temp (!) 97.1 F (36.2 C) (Temporal)   Ht 5' 3"  (1.6 m)   Wt 196 lb (88.9 kg)   SpO2 98%   BMI 34.72 kg/m  Wt Readings from Last 3 Encounters:  04/26/21 196 lb (88.9 kg)  02/16/21 192 lb 9.6 oz (87.4 kg)  10/20/20 192 lb (87.1 kg)    Health Maintenance Due  Topic Date Due  . COVID-19 Vaccine (1) Never done    There are no preventive care reminders to display for this patient.   Lab Results  Component Value Date   TSH 4.130 02/16/2021   Lab Results  Component Value Date   WBC 6.9 10/20/2020   HGB 12.5 10/20/2020   HCT 39.8 10/20/2020   MCV 82 10/20/2020   PLT 245 10/20/2020   Lab Results  Component Value Date   NA 141 02/16/2021   K 4.4 02/16/2021   CO2 25 02/16/2021   GLUCOSE 90 02/16/2021   BUN 17 02/16/2021   CREATININE 1.20 (H) 02/16/2021   BILITOT 0.3 02/16/2021   ALKPHOS 75 02/16/2021   AST 24 02/16/2021   ALT 13 02/16/2021   PROT 7.3 02/16/2021   ALBUMIN 4.6 02/16/2021   CALCIUM 10.0 02/16/2021   EGFR 47 (L) 02/16/2021   Lab Results  Component Value Date   CHOL 187 10/20/2020   Lab Results  Component Value Date   HDL 73 10/20/2020   Lab Results  Component Value Date   LDLCALC 98 10/20/2020   Lab Results  Component Value Date   TRIG 92 10/20/2020   Lab Results  Component Value Date   CHOLHDL 2.6 10/20/2020   No results found for: HGBA1C     Assessment & Plan:   Problem List Items Addressed This Visit      Musculoskeletal and Integument   Tick bite of thigh with local reaction, initial encounter - Primary    red, itchy skin of the left upper back thigh  From tick bite in  the last 24 -48 hours. Prophylaxis given to patient 2 tablets of doxycycline 100 mg by mouth.  Advised patient to follow-up with flulike symptoms.  Return in 2 to 3 weeks for Lyme ABS Western blot test.  Patient verbalized understanding  Rx sent to pharmacy  Follow-up with  worsening unresolved symptoms.      Relevant Medications   doxycycline (VIBRA-TABS) 100 MG tablet   Other Relevant Orders  Lyme Disease Serology w/Reflex       Meds ordered this encounter  Medications  . doxycycline (VIBRA-TABS) 100 MG tablet    Sig: Take 1 tablet (100 mg total) by mouth 2 (two) times daily.    Dispense:  2 tablet    Refill:  0    Order Specific Question:   Supervising Provider    Answer:   Janora Norlander [[id number]]     Ivy Lynn, NP

## 2021-04-27 ENCOUNTER — Ambulatory Visit: Payer: Medicare PPO | Admitting: Nurse Practitioner

## 2021-04-28 ENCOUNTER — Other Ambulatory Visit: Payer: Self-pay | Admitting: *Deleted

## 2021-04-28 DIAGNOSIS — G629 Polyneuropathy, unspecified: Secondary | ICD-10-CM

## 2021-04-28 DIAGNOSIS — E782 Mixed hyperlipidemia: Secondary | ICD-10-CM

## 2021-04-28 DIAGNOSIS — E039 Hypothyroidism, unspecified: Secondary | ICD-10-CM

## 2021-04-28 DIAGNOSIS — K219 Gastro-esophageal reflux disease without esophagitis: Secondary | ICD-10-CM

## 2021-04-28 DIAGNOSIS — R42 Dizziness and giddiness: Secondary | ICD-10-CM

## 2021-04-28 DIAGNOSIS — I10 Essential (primary) hypertension: Secondary | ICD-10-CM

## 2021-04-28 MED ORDER — VALSARTAN-HYDROCHLOROTHIAZIDE 80-12.5 MG PO TABS: 1.0000 | ORAL_TABLET | Freq: Every day | ORAL | 0 refills | Status: DC

## 2021-04-28 MED ORDER — MECLIZINE HCL 25 MG PO TABS: ORAL_TABLET | ORAL | 0 refills | Status: DC

## 2021-04-28 MED ORDER — ROSUVASTATIN CALCIUM 5 MG PO TABS: 5.0000 mg | ORAL_TABLET | Freq: Every day | ORAL | 0 refills | Status: DC

## 2021-04-28 MED ORDER — LEVOTHYROXINE SODIUM 25 MCG PO TABS
25.0000 ug | ORAL_TABLET | Freq: Every day | ORAL | 2 refills | Status: AC
Start: 2021-04-28 — End: ?

## 2021-04-28 MED ORDER — PANTOPRAZOLE SODIUM 40 MG PO TBEC: 40.0000 mg | DELAYED_RELEASE_TABLET | Freq: Every day | ORAL | 2 refills | Status: AC

## 2021-04-30 ENCOUNTER — Telehealth: Payer: Self-pay | Admitting: Family Medicine

## 2021-05-20 DIAGNOSIS — H52203 Unspecified astigmatism, bilateral: Secondary | ICD-10-CM | POA: Diagnosis not present

## 2021-05-20 DIAGNOSIS — H524 Presbyopia: Secondary | ICD-10-CM | POA: Diagnosis not present

## 2021-05-20 DIAGNOSIS — Z961 Presence of intraocular lens: Secondary | ICD-10-CM | POA: Diagnosis not present

## 2021-06-20 ENCOUNTER — Other Ambulatory Visit: Payer: Self-pay | Admitting: Family Medicine

## 2021-06-20 DIAGNOSIS — R42 Dizziness and giddiness: Secondary | ICD-10-CM

## 2021-07-11 ENCOUNTER — Other Ambulatory Visit: Payer: Self-pay | Admitting: Family Medicine

## 2021-07-11 DIAGNOSIS — E782 Mixed hyperlipidemia: Secondary | ICD-10-CM

## 2021-07-12 NOTE — Telephone Encounter (Signed)
[name] [name]. Needs 6 mos chkup in Sept. Mail order sent

## 2021-07-13 ENCOUNTER — Other Ambulatory Visit: Payer: Self-pay | Admitting: Family Medicine

## 2021-07-13 DIAGNOSIS — I10 Essential (primary) hypertension: Secondary | ICD-10-CM

## 2021-07-14 NOTE — Telephone Encounter (Signed)
[name] [name]. Needs 6 mos ckup in Sept mail order sent

## 2021-09-02 ENCOUNTER — Other Ambulatory Visit: Payer: Self-pay | Admitting: Family Medicine

## 2021-09-02 DIAGNOSIS — G629 Polyneuropathy, unspecified: Secondary | ICD-10-CM

## 2021-09-06 ENCOUNTER — Telehealth: Payer: Self-pay | Admitting: Family Medicine

## 2021-09-06 NOTE — Telephone Encounter (Signed)
Lmtcb to schedule appt for refills.

## 2021-09-06 NOTE — Telephone Encounter (Signed)
Lyrica is a controlled substance. She will need an office visit with her PCP for refills.

## 2021-09-06 NOTE — Telephone Encounter (Signed)
  Prescription Request  09/06/2021  Is this a "Controlled Substance" medicine? no Have you seen your PCP in the last 2 weeks? no If YES, route message to pool  -  If NO, patient needs to be scheduled for appointment.  What is the name of the medication or equipment?pregabalin (LYRICA) 75 MG capsule  Have you contacted your pharmacy to request a refill?  Yes but ntbs. She is aware but asked if a refill can be sent in because she is out and does not want to stop it without talking to nurse about it. She called in to schedule an apt 09/21/2021   Which pharmacy would you like this sent to? [facility].   Patient notified that their request is being sent to the clinical staff for review and that they should receive a response within 2 business days.

## 2021-09-10 NOTE — Telephone Encounter (Signed)
Patient has appointment scheduled for med refills on 09/21/21 with Hendricks Limes.

## 2021-09-14 ENCOUNTER — Telehealth: Payer: Self-pay | Admitting: Family Medicine

## 2021-09-14 NOTE — Telephone Encounter (Signed)
Left message for patient to call back and schedule Medicare Annual Wellness Visit (AWV) to be completed by video or phone.   Last AWV: 04/25/2019  Please schedule at anytime with [facility] Advisor.  45 minute appointment  Any questions, please contact me at [phone]

## 2021-09-21 ENCOUNTER — Ambulatory Visit: Payer: Medicare PPO | Admitting: Family Medicine

## 2021-09-21 ENCOUNTER — Other Ambulatory Visit: Payer: Self-pay

## 2021-09-21 ENCOUNTER — Encounter: Payer: Self-pay | Admitting: Family Medicine

## 2021-09-21 VITALS — BP 127/82 | HR 78 | Temp 97.3°F | Ht 63.0 in | Wt 190.0 lb

## 2021-09-21 DIAGNOSIS — I10 Essential (primary) hypertension: Secondary | ICD-10-CM

## 2021-09-21 DIAGNOSIS — K219 Gastro-esophageal reflux disease without esophagitis: Secondary | ICD-10-CM | POA: Diagnosis not present

## 2021-09-21 DIAGNOSIS — D369 Benign neoplasm, unspecified site: Secondary | ICD-10-CM

## 2021-09-21 DIAGNOSIS — G629 Polyneuropathy, unspecified: Secondary | ICD-10-CM

## 2021-09-21 DIAGNOSIS — E782 Mixed hyperlipidemia: Secondary | ICD-10-CM

## 2021-09-21 DIAGNOSIS — Z79899 Other long term (current) drug therapy: Secondary | ICD-10-CM

## 2021-09-21 DIAGNOSIS — N1831 Chronic kidney disease, stage 3a: Secondary | ICD-10-CM

## 2021-09-21 DIAGNOSIS — M8589 Other specified disorders of bone density and structure, multiple sites: Secondary | ICD-10-CM

## 2021-09-21 DIAGNOSIS — E039 Hypothyroidism, unspecified: Secondary | ICD-10-CM

## 2021-09-21 MED ORDER — ROSUVASTATIN CALCIUM 5 MG PO TABS: 5.0000 mg | ORAL_TABLET | Freq: Every day | ORAL | 1 refills | Status: AC

## 2021-09-21 MED ORDER — PREGABALIN 75 MG PO CAPS
75.0000 mg | ORAL_CAPSULE | Freq: Two times a day (BID) | ORAL | 1 refills | Status: AC
Start: 2021-09-21 — End: ?

## 2021-09-21 MED ORDER — VALSARTAN-HYDROCHLOROTHIAZIDE 80-12.5 MG PO TABS: 1.0000 | ORAL_TABLET | Freq: Every day | ORAL | 1 refills | Status: DC

## 2021-09-21 NOTE — Progress Notes (Addendum)
Assessment & Plan:  1. Essential hypertension Well controlled on current regimen.  - valsartan-hydrochlorothiazide (DIOVAN-HCT) 80-12.5 MG tablet; Take 1 tablet by mouth daily.  Dispense: 90 tablet; Refill: 1 - CMP14+EGFR  2. Mixed hyperlipidemia Well controlled on current regimen.  - rosuvastatin (CRESTOR) 5 MG tablet; Take 1 tablet (5 mg total) by mouth daily.  Dispense: 90 tablet; Refill: 1 - CMP14+EGFR  3-4. Neuropathy/Controlled substance agreement signed Well controlled on current regimen. Controlled substance agreement in place. Urine drug screen as expected. PDMP reviewed with no concerning findings.  - pregabalin (LYRICA) 75 MG capsule; Take 1 capsule (75 mg total) by mouth 2 (two) times daily.  Dispense: 180 capsule; Refill: 1 - CMP14+EGFR  5. Stage 3a chronic kidney disease (HCC) - CMP14+EGFR  6. Gastroesophageal reflux disease without esophagitis Well controlled on current regimen.  - CMP14+EGFR  7. Acquired hypothyroidism Well controlled on current regimen.   8. Tubular adenoma Repeat colonoscopy in 3 years.  9. Osteopenia of multiple sites Continue vitamin D supplement. Encouraged calcium supplement.   Addendum 09/27/2021: Rx'd Wilder Glade for CKD.  Return in about 6 months (around 03/22/2022) for annual physical.  Hendricks Limes, MSN, APRN, FNP-[facility]  Subjective:    Patient ID: [name] [name], female    DOB: 10/07/1946, 75 y.o.   MRN: [id number]  Patient Care Team: Loman Brooklyn, FNP as PCP - General (Family Medicine)   Chief Complaint:  Chief Complaint  Patient presents with   Hyperlipidemia   Hypertension    Check up of chronic medical conditions     HPI: [name] [name] is a 75 y.o. female presenting on 09/21/2021 for Hyperlipidemia and Hypertension (Check up of chronic medical conditions )  Hypertension Taking Valsartan-HCTZ daily.  Hyperlipidemia Taking rosuvastatin daily.  GERD Taking pantoprazole  daily.  Neuropathy Doing well with Lyrica twice daily. She does have a controlled substance agreement in place.  Hypothyroidism Taking levothyroxine every morning.  Tubular Adenoma Patient had her colonoscopy on 04/09/2021 with Dr. Rocco Serene at [facility]. She did have a polyp >1 cm that was a tubular adenoma and is therefore to have her repeat colonoscopy in 3  years.   Osteopenia Patient's last DEXA scan was completed on 10/20/2020 and continued to show osteopenia. She is taking a vitamin D supplement. It is unclear if she is taking a calcium supplement.  New complaints: None   Social history:  Relevant past medical, surgical, family and social history reviewed and updated as indicated. Interim medical history since our last visit reviewed.  Allergies and medications reviewed and updated.  DATA REVIEWED: CHART IN EPIC  ROS: Negative unless specifically indicated above in HPI.    Current Outpatient Medications:    Cholecalciferol (VITAMIN D3) 5000 units CAPS, Take by mouth., Disp: , Rfl:    levothyroxine (SYNTHROID) 25 MCG tablet, Take 1 tablet (25 mcg total) by mouth daily before breakfast., Disp: 90 tablet, Rfl: 2   loratadine (CLARITIN) 10 MG tablet, Take 1 tablet (10 mg total) by mouth daily., Disp: 30 tablet, Rfl: 0   meclizine (ANTIVERT) 25 MG tablet, TAKE 1 TABLET THREE TIMES DAILY AS NEEDED FOR DIZZINESS, Disp: 180 tablet, Rfl: 1   pantoprazole (PROTONIX) 40 MG tablet, Take 1 tablet (40 mg total) by mouth daily., Disp: 90 tablet, Rfl: 2   POTASSIUM PO, Take by mouth daily., Disp: , Rfl:    pregabalin (LYRICA) 75 MG capsule, Take 1 capsule (75 mg total) by mouth 2 (two) times daily., Disp: 60  capsule, Rfl: 5   rosuvastatin (CRESTOR) 5 MG tablet, TAKE 1 TABLET EVERY DAY, Disp: 90 tablet, Rfl: 0   valsartan-hydrochlorothiazide (DIOVAN-HCT) 80-12.5 MG tablet, TAKE 1 TABLET EVERY DAY, Disp: 90 tablet, Rfl: 0   Allergies  Allergen Reactions   Augmentin  [Amoxicillin-Pot Clavulanate] Other (See Comments)    Facial swelling   Past Medical History:  Diagnosis Date   Allergic rhinitis    HTN (hypertension)    Neuropathy 06/16/2020    Past Surgical History:  Procedure Laterality Date   CARPAL TUNNEL RELEASE     RIGHT   PROSTATE SURGERY     TOTAL ABDOMINAL HYSTERECTOMY W/ BILATERAL SALPINGOOPHORECTOMY      Social History   Socioeconomic History   Marital status: Divorced    Spouse name: Not on file   Number of children: 2   Years of education: Not on file   Highest education level: 10th grade  Occupational History   Occupation: Retired    Comment: retired from [employer] work  Tobacco Use   Smoking status: Never   Smokeless tobacco: Never  Vaping Use   Vaping Use: Never used  Substance and Sexual Activity   Alcohol use: No   Drug use: No   Sexual activity: Not Currently  Other Topics Concern   Not on file  Social History Narrative   Has 3 children   Social Determinants of Health   Financial Resource Strain: Not on file  Food Insecurity: Not on file  Transportation Needs: Not on file  Physical Activity: Not on file  Stress: Not on file  Social Connections: Not on file  Intimate Partner Violence: Not on file        Objective:    BP 127/82   Pulse 78   Temp (!) 97.3 F (36.3 C) (Temporal)   Ht _0  (1.6 m)   Wt 190 lb (86.2 kg)   SpO2 93%   BMI 33.66 kg/m   Wt Readings from Last 3 Encounters:  09/21/21 190 lb (86.2 kg)  04/26/21 196 lb (88.9 kg)  02/16/21 192 lb 9.6 oz (87.4 kg)    Physical Exam Vitals reviewed.  Constitutional:      General: She is not in acute distress.    Appearance: Normal appearance. She is obese. She is not ill-appearing, toxic-appearing or diaphoretic.  HENT:     Head: Normocephalic and atraumatic.  Eyes:     General: No scleral icterus.       Right eye: No discharge.        Left eye: No discharge.     Conjunctiva/sclera: Conjunctivae normal.  Cardiovascular:      Rate and Rhythm: Normal rate and regular rhythm.     Heart sounds: Normal heart sounds. No murmur heard.   No friction rub. No gallop.  Pulmonary:     Effort: Pulmonary effort is normal. No respiratory distress.     Breath sounds: Normal breath sounds. No stridor. No wheezing, rhonchi or rales.  Musculoskeletal:        General: Normal range of motion.     Cervical back: Normal range of motion.  Skin:    General: Skin is warm and dry.     Capillary Refill: Capillary refill takes less than 2 seconds.  Neurological:     General: No focal deficit present.     Mental Status: She is alert and oriented to person, place, and time. Mental status is at baseline.  Psychiatric:  Mood and Affect: Mood normal.        Behavior: Behavior normal.        Thought Content: Thought content normal.        Judgment: Judgment normal.    Lab Results  Component Value Date   TSH 4.130 02/16/2021   Lab Results  Component Value Date   WBC 6.9 10/20/2020   HGB 12.5 10/20/2020   HCT 39.8 10/20/2020   MCV 82 10/20/2020   PLT 245 10/20/2020   Lab Results  Component Value Date   NA 141 02/16/2021   K 4.4 02/16/2021   CO2 25 02/16/2021   GLUCOSE 90 02/16/2021   BUN 17 02/16/2021   CREATININE 1.20 (H) 02/16/2021   BILITOT 0.3 02/16/2021   ALKPHOS 75 02/16/2021   AST 24 02/16/2021   ALT 13 02/16/2021   PROT 7.3 02/16/2021   ALBUMIN 4.6 02/16/2021   CALCIUM 10.0 02/16/2021   EGFR 47 (L) 02/16/2021   Lab Results  Component Value Date   CHOL 187 10/20/2020   Lab Results  Component Value Date   HDL 73 10/20/2020   Lab Results  Component Value Date   LDLCALC 98 10/20/2020   Lab Results  Component Value Date   TRIG 92 10/20/2020   Lab Results  Component Value Date   CHOLHDL 2.6 10/20/2020   No results found for: HGBA1C

## 2021-09-22 LAB — CMP14+EGFR
ALT: 14 IU/L (ref 0–32)
AST: 21 IU/L (ref 0–40)
Albumin/Globulin Ratio: 2.2 (ref 1.2–2.2)
Albumin: 4.8 g/dL — ABNORMAL HIGH (ref 3.7–4.7)
Alkaline Phosphatase: 74 IU/L (ref 44–121)
BUN/Creatinine Ratio: 13 (ref 12–28)
BUN: 17 mg/dL (ref 8–27)
Bilirubin Total: 0.2 mg/dL (ref 0.0–1.2)
CO2: 24 mmol/L (ref 20–29)
Calcium: 10.3 mg/dL (ref 8.7–10.3)
Chloride: 101 mmol/L (ref 96–106)
Creatinine, Ser: 1.35 mg/dL — ABNORMAL HIGH (ref 0.57–1.00)
Globulin, Total: 2.2 g/dL (ref 1.5–4.5)
Glucose: 97 mg/dL (ref 70–99)
Potassium: 4 mmol/L (ref 3.5–5.2)
Sodium: 142 mmol/L (ref 134–144)
Total Protein: 7 g/dL (ref 6.0–8.5)
eGFR: 41 mL/min/{1.73_m2} — ABNORMAL LOW (ref >59)

## 2021-09-23 ENCOUNTER — Encounter: Payer: Self-pay | Admitting: Family Medicine

## 2021-09-24 ENCOUNTER — Telehealth: Payer: Self-pay

## 2021-09-24 ENCOUNTER — Encounter: Payer: Self-pay | Admitting: Family Medicine

## 2021-09-24 DIAGNOSIS — D369 Benign neoplasm, unspecified site: Secondary | ICD-10-CM

## 2021-09-24 DIAGNOSIS — M8589 Other specified disorders of bone density and structure, multiple sites: Secondary | ICD-10-CM

## 2021-09-24 DIAGNOSIS — N1831 Chronic kidney disease, stage 3a: Secondary | ICD-10-CM | POA: Insufficient documentation

## 2021-09-24 HISTORY — DX: Other specified disorders of bone density and structure, multiple sites: M85.89

## 2021-09-24 HISTORY — DX: Benign neoplasm, unspecified site: D36.9

## 2021-09-27 MED ORDER — DAPAGLIFLOZIN PROPANEDIOL 10 MG PO TABS: 10.0000 mg | ORAL_TABLET | Freq: Every day | ORAL | 2 refills | Status: AC

## 2021-09-27 NOTE — Addendum Note (Signed)
Addended by: Loman Brooklyn on: 09/27/2021 08:40 AM   Modules accepted: Orders

## 2021-10-05 DIAGNOSIS — I10 Essential (primary) hypertension: Secondary | ICD-10-CM | POA: Diagnosis not present

## 2021-10-05 DIAGNOSIS — K219 Gastro-esophageal reflux disease without esophagitis: Secondary | ICD-10-CM | POA: Diagnosis not present

## 2021-10-05 DIAGNOSIS — Z299 Encounter for prophylactic measures, unspecified: Secondary | ICD-10-CM | POA: Diagnosis not present

## 2021-10-05 DIAGNOSIS — G629 Polyneuropathy, unspecified: Secondary | ICD-10-CM | POA: Diagnosis not present

## 2021-10-05 DIAGNOSIS — Z6834 Body mass index (BMI) 34.0-34.9, adult: Secondary | ICD-10-CM | POA: Diagnosis not present

## 2021-10-05 DIAGNOSIS — E7800 Pure hypercholesterolemia, unspecified: Secondary | ICD-10-CM | POA: Diagnosis not present

## 2021-10-07 ENCOUNTER — Encounter: Payer: Self-pay | Admitting: *Deleted

## 2021-10-08 ENCOUNTER — Encounter: Payer: Self-pay | Admitting: *Deleted

## 2021-10-08 DIAGNOSIS — I10 Essential (primary) hypertension: Secondary | ICD-10-CM | POA: Diagnosis not present

## 2021-10-08 DIAGNOSIS — Z1331 Encounter for screening for depression: Secondary | ICD-10-CM | POA: Diagnosis not present

## 2021-10-08 DIAGNOSIS — Z7189 Other specified counseling: Secondary | ICD-10-CM | POA: Diagnosis not present

## 2021-10-08 DIAGNOSIS — Z6834 Body mass index (BMI) 34.0-34.9, adult: Secondary | ICD-10-CM | POA: Diagnosis not present

## 2021-10-08 DIAGNOSIS — E661 Drug-induced obesity: Secondary | ICD-10-CM | POA: Diagnosis not present

## 2021-10-08 DIAGNOSIS — D692 Other nonthrombocytopenic purpura: Secondary | ICD-10-CM | POA: Diagnosis not present

## 2021-10-08 DIAGNOSIS — Z299 Encounter for prophylactic measures, unspecified: Secondary | ICD-10-CM | POA: Diagnosis not present

## 2021-10-08 DIAGNOSIS — Z79899 Other long term (current) drug therapy: Secondary | ICD-10-CM | POA: Diagnosis not present

## 2021-10-08 DIAGNOSIS — Z1339 Encounter for screening examination for other mental health and behavioral disorders: Secondary | ICD-10-CM | POA: Diagnosis not present

## 2021-10-08 DIAGNOSIS — R5383 Other fatigue: Secondary | ICD-10-CM | POA: Diagnosis not present

## 2021-10-08 DIAGNOSIS — Z0000 Encounter for general adult medical examination without abnormal findings: Secondary | ICD-10-CM | POA: Diagnosis not present

## 2021-10-20 ENCOUNTER — Telehealth: Payer: Self-pay | Admitting: Family Medicine

## 2021-10-20 NOTE — Telephone Encounter (Signed)
Left message for patient to call back and schedule Medicare Annual Wellness Visit (AWV) to be completed by video or phone.   Last AWV: 04/25/2019  Please schedule at anytime with [facility] Advisor.  45 minute appointment  Any questions, please contact me at [phone]

## 2021-10-25 DIAGNOSIS — I10 Essential (primary) hypertension: Secondary | ICD-10-CM | POA: Diagnosis not present

## 2021-10-25 DIAGNOSIS — Z299 Encounter for prophylactic measures, unspecified: Secondary | ICD-10-CM | POA: Diagnosis not present

## 2021-10-25 DIAGNOSIS — E039 Hypothyroidism, unspecified: Secondary | ICD-10-CM | POA: Diagnosis not present

## 2021-10-25 DIAGNOSIS — Z6834 Body mass index (BMI) 34.0-34.9, adult: Secondary | ICD-10-CM | POA: Diagnosis not present

## 2021-10-25 DIAGNOSIS — R5383 Other fatigue: Secondary | ICD-10-CM | POA: Diagnosis not present

## 2021-10-25 DIAGNOSIS — E7800 Pure hypercholesterolemia, unspecified: Secondary | ICD-10-CM | POA: Diagnosis not present

## 2021-10-26 DIAGNOSIS — E2839 Other primary ovarian failure: Secondary | ICD-10-CM | POA: Diagnosis not present

## 2021-11-07 ENCOUNTER — Other Ambulatory Visit: Payer: Self-pay | Admitting: Family Medicine

## 2021-11-07 DIAGNOSIS — I10 Essential (primary) hypertension: Secondary | ICD-10-CM

## 2021-11-10 ENCOUNTER — Other Ambulatory Visit: Payer: Self-pay | Admitting: Internal Medicine

## 2021-11-10 DIAGNOSIS — Z139 Encounter for screening, unspecified: Secondary | ICD-10-CM

## 2021-11-15 ENCOUNTER — Ambulatory Visit
Admission: RE | Admit: 2021-11-15 | Discharge: 2021-11-15 | Disposition: A | Discharge status: Home / Self Care | Payer: Medicare PPO | Source: Ambulatory Visit | Attending: Internal Medicine | Admitting: Internal Medicine

## 2021-11-15 DIAGNOSIS — Z1231 Encounter for screening mammogram for malignant neoplasm of breast: Secondary | ICD-10-CM | POA: Diagnosis not present

## 2021-11-15 DIAGNOSIS — Z139 Encounter for screening, unspecified: Secondary | ICD-10-CM

## 2021-12-13 ENCOUNTER — Telehealth: Payer: Self-pay | Admitting: Family Medicine

## 2021-12-13 DIAGNOSIS — L0390 Cellulitis, unspecified: Secondary | ICD-10-CM | POA: Diagnosis not present

## 2021-12-13 DIAGNOSIS — D692 Other nonthrombocytopenic purpura: Secondary | ICD-10-CM | POA: Diagnosis not present

## 2021-12-13 DIAGNOSIS — M79672 Pain in left foot: Secondary | ICD-10-CM | POA: Diagnosis not present

## 2021-12-13 DIAGNOSIS — Z6834 Body mass index (BMI) 34.0-34.9, adult: Secondary | ICD-10-CM | POA: Diagnosis not present

## 2021-12-13 DIAGNOSIS — I10 Essential (primary) hypertension: Secondary | ICD-10-CM | POA: Diagnosis not present

## 2021-12-13 DIAGNOSIS — Z2821 Immunization not carried out because of patient refusal: Secondary | ICD-10-CM | POA: Diagnosis not present

## 2021-12-13 DIAGNOSIS — Z299 Encounter for prophylactic measures, unspecified: Secondary | ICD-10-CM | POA: Diagnosis not present

## 2021-12-13 NOTE — Telephone Encounter (Signed)
Left message for patient to call back and schedule Medicare Annual Wellness Visit (AWV) to be completed by video or phone.   Last AWV: 04/25/2019  Please schedule at anytime with [place]  45 minute appointment  Any questions, please contact me at [phone]

## 2021-12-14 DIAGNOSIS — M7732 Calcaneal spur, left foot: Secondary | ICD-10-CM | POA: Diagnosis not present

## 2021-12-14 DIAGNOSIS — M79605 Pain in left leg: Secondary | ICD-10-CM | POA: Diagnosis not present

## 2021-12-14 DIAGNOSIS — M7989 Other specified soft tissue disorders: Secondary | ICD-10-CM | POA: Diagnosis not present

## 2021-12-14 DIAGNOSIS — L0390 Cellulitis, unspecified: Secondary | ICD-10-CM | POA: Diagnosis not present

## 2022-01-10 ENCOUNTER — Telehealth: Payer: Self-pay | Admitting: Family Medicine

## 2022-01-10 NOTE — Telephone Encounter (Signed)
Left message for patient to call back and schedule Medicare Annual Wellness Visit (AWV) to be completed by video or phone.   Last AWV: 04/25/2019  Please schedule at anytime with [place]  45 minute appointment  Any questions, please contact me at [phone]

## 2022-01-25 DIAGNOSIS — E039 Hypothyroidism, unspecified: Secondary | ICD-10-CM | POA: Diagnosis not present

## 2022-01-25 DIAGNOSIS — Z299 Encounter for prophylactic measures, unspecified: Secondary | ICD-10-CM | POA: Diagnosis not present

## 2022-01-25 DIAGNOSIS — M109 Gout, unspecified: Secondary | ICD-10-CM | POA: Diagnosis not present

## 2022-01-25 DIAGNOSIS — M549 Dorsalgia, unspecified: Secondary | ICD-10-CM | POA: Diagnosis not present

## 2022-01-25 DIAGNOSIS — Z6834 Body mass index (BMI) 34.0-34.9, adult: Secondary | ICD-10-CM | POA: Diagnosis not present

## 2022-01-25 DIAGNOSIS — I10 Essential (primary) hypertension: Secondary | ICD-10-CM | POA: Diagnosis not present

## 2022-02-07 ENCOUNTER — Telehealth: Payer: Self-pay | Admitting: Family Medicine

## 2022-02-07 NOTE — Telephone Encounter (Signed)
Left message for patient to call back and schedule Medicare Annual Wellness Visit (AWV) to be completed by video or phone.  ? ?Last AWV: 04/25/2019 ? ?Please schedule at anytime with [place] ? ?45 minute appointment ? ?Any questions, please contact me at [phone]  ?  ?  ?

## 2022-03-25 ENCOUNTER — Other Ambulatory Visit: Payer: Self-pay | Admitting: Family Medicine

## 2022-03-25 DIAGNOSIS — G629 Polyneuropathy, unspecified: Secondary | ICD-10-CM

## 2022-03-25 DIAGNOSIS — Z79899 Other long term (current) drug therapy: Secondary | ICD-10-CM

## 2022-03-27 ENCOUNTER — Other Ambulatory Visit: Payer: Self-pay | Admitting: Family Medicine

## 2022-03-27 DIAGNOSIS — G629 Polyneuropathy, unspecified: Secondary | ICD-10-CM

## 2022-03-27 DIAGNOSIS — Z79899 Other long term (current) drug therapy: Secondary | ICD-10-CM

## 2022-03-28 DIAGNOSIS — E039 Hypothyroidism, unspecified: Secondary | ICD-10-CM | POA: Diagnosis not present

## 2022-04-05 ENCOUNTER — Telehealth: Payer: Self-pay | Admitting: Family Medicine

## 2022-04-05 NOTE — Telephone Encounter (Signed)
Left message for patient to call back and schedule Medicare Annual Wellness Visit (AWV) to be completed by video or phone.  ? ?Last AWV: 04/25/2019 ? ?Please schedule at anytime with [place] ? ?45 minute appointment ? ?Any questions, please contact me at [phone]  ?  ?  ?

## 2022-05-12 DIAGNOSIS — I10 Essential (primary) hypertension: Secondary | ICD-10-CM | POA: Diagnosis not present

## 2022-05-12 DIAGNOSIS — Z713 Dietary counseling and surveillance: Secondary | ICD-10-CM | POA: Diagnosis not present

## 2022-05-12 DIAGNOSIS — W57XXXA Bitten or stung by nonvenomous insect and other nonvenomous arthropods, initial encounter: Secondary | ICD-10-CM | POA: Diagnosis not present

## 2022-05-12 DIAGNOSIS — Z6834 Body mass index (BMI) 34.0-34.9, adult: Secondary | ICD-10-CM | POA: Diagnosis not present

## 2022-05-12 DIAGNOSIS — Z299 Encounter for prophylactic measures, unspecified: Secondary | ICD-10-CM | POA: Diagnosis not present

## 2022-05-23 DIAGNOSIS — H524 Presbyopia: Secondary | ICD-10-CM | POA: Diagnosis not present

## 2022-05-23 DIAGNOSIS — H52203 Unspecified astigmatism, bilateral: Secondary | ICD-10-CM | POA: Diagnosis not present

## 2022-05-23 DIAGNOSIS — Z961 Presence of intraocular lens: Secondary | ICD-10-CM | POA: Diagnosis not present

## 2022-07-25 DIAGNOSIS — E039 Hypothyroidism, unspecified: Secondary | ICD-10-CM | POA: Diagnosis not present

## 2022-07-25 DIAGNOSIS — G629 Polyneuropathy, unspecified: Secondary | ICD-10-CM | POA: Diagnosis not present

## 2022-07-25 DIAGNOSIS — Z299 Encounter for prophylactic measures, unspecified: Secondary | ICD-10-CM | POA: Diagnosis not present

## 2022-07-25 DIAGNOSIS — Z6833 Body mass index (BMI) 33.0-33.9, adult: Secondary | ICD-10-CM | POA: Diagnosis not present

## 2022-07-25 DIAGNOSIS — I10 Essential (primary) hypertension: Secondary | ICD-10-CM | POA: Diagnosis not present

## 2022-10-12 DIAGNOSIS — Z1331 Encounter for screening for depression: Secondary | ICD-10-CM | POA: Diagnosis not present

## 2022-10-12 DIAGNOSIS — Z79899 Other long term (current) drug therapy: Secondary | ICD-10-CM | POA: Diagnosis not present

## 2022-10-12 DIAGNOSIS — Z299 Encounter for prophylactic measures, unspecified: Secondary | ICD-10-CM | POA: Diagnosis not present

## 2022-10-12 DIAGNOSIS — E039 Hypothyroidism, unspecified: Secondary | ICD-10-CM | POA: Diagnosis not present

## 2022-10-12 DIAGNOSIS — E7800 Pure hypercholesterolemia, unspecified: Secondary | ICD-10-CM | POA: Diagnosis not present

## 2022-10-12 DIAGNOSIS — E6609 Other obesity due to excess calories: Secondary | ICD-10-CM | POA: Diagnosis not present

## 2022-10-12 DIAGNOSIS — R5383 Other fatigue: Secondary | ICD-10-CM | POA: Diagnosis not present

## 2022-10-12 DIAGNOSIS — Z0000 Encounter for general adult medical examination without abnormal findings: Secondary | ICD-10-CM | POA: Diagnosis not present

## 2022-10-12 DIAGNOSIS — I10 Essential (primary) hypertension: Secondary | ICD-10-CM | POA: Diagnosis not present

## 2022-10-12 DIAGNOSIS — Z6833 Body mass index (BMI) 33.0-33.9, adult: Secondary | ICD-10-CM | POA: Diagnosis not present

## 2022-10-12 DIAGNOSIS — Z1339 Encounter for screening examination for other mental health and behavioral disorders: Secondary | ICD-10-CM | POA: Diagnosis not present

## 2022-10-12 DIAGNOSIS — Z7189 Other specified counseling: Secondary | ICD-10-CM | POA: Diagnosis not present

## 2022-11-01 ENCOUNTER — Other Ambulatory Visit: Payer: Self-pay | Admitting: Internal Medicine

## 2022-11-01 DIAGNOSIS — Z1231 Encounter for screening mammogram for malignant neoplasm of breast: Secondary | ICD-10-CM

## 2022-11-23 ENCOUNTER — Ambulatory Visit
Admission: RE | Admit: 2022-11-23 | Discharge: 2022-11-23 | Disposition: A | Discharge status: Home / Self Care | Payer: Medicare Other | Source: Ambulatory Visit | Attending: Internal Medicine | Admitting: Internal Medicine

## 2022-11-23 DIAGNOSIS — Z1231 Encounter for screening mammogram for malignant neoplasm of breast: Secondary | ICD-10-CM | POA: Diagnosis not present

## 2023-02-07 ENCOUNTER — Other Ambulatory Visit: Payer: Self-pay | Admitting: Family Medicine

## 2023-02-07 DIAGNOSIS — I10 Essential (primary) hypertension: Secondary | ICD-10-CM

## 2023-02-10 ENCOUNTER — Other Ambulatory Visit: Payer: Self-pay | Admitting: Family Medicine

## 2023-02-10 DIAGNOSIS — I10 Essential (primary) hypertension: Secondary | ICD-10-CM

## 2023-02-27 IMAGING — MG MM DIGITAL SCREENING BILAT W/ TOMO AND CAD
6 of 10 series · 6 of 30 positions shown · non-contrast
Comparison: Previous exam(s).

CLINICAL DATA: Screening.

EXAM:
DIGITAL SCREENING BILATERAL MAMMOGRAM WITH TOMOSYNTHESIS AND CAD
TECHNIQUE: Bilateral screening digital craniocaudal and mediolateral oblique
mammograms were obtained. Bilateral screening digital breast
tomosynthesis was performed. The images were evaluated with
computer-aided detection.

[R MLO synth-2D]
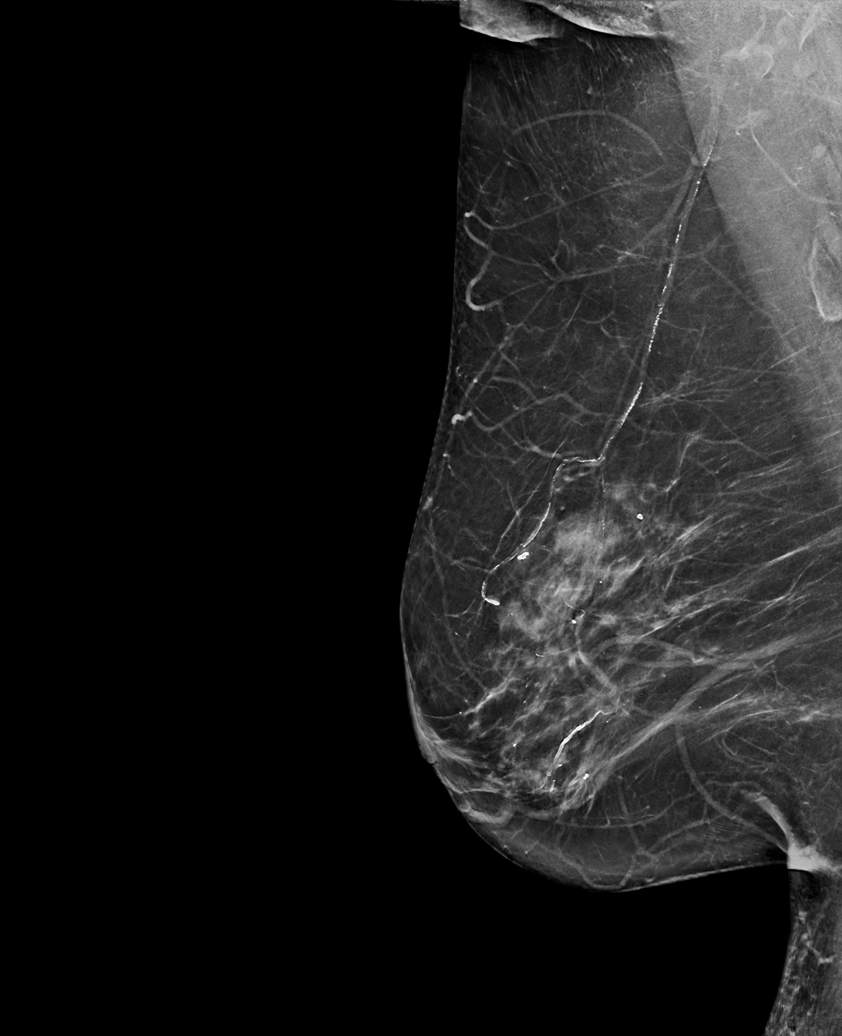

[L CC synth-2D (1 of 2)]
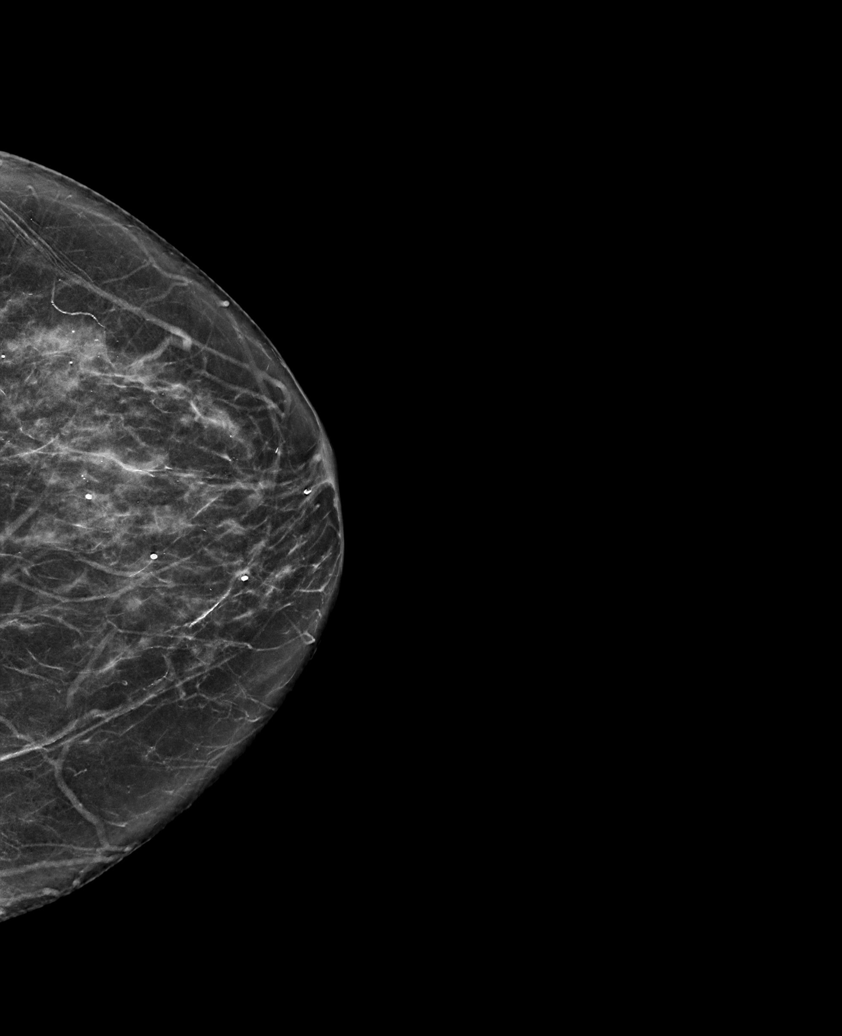

[R CC synth-2D]
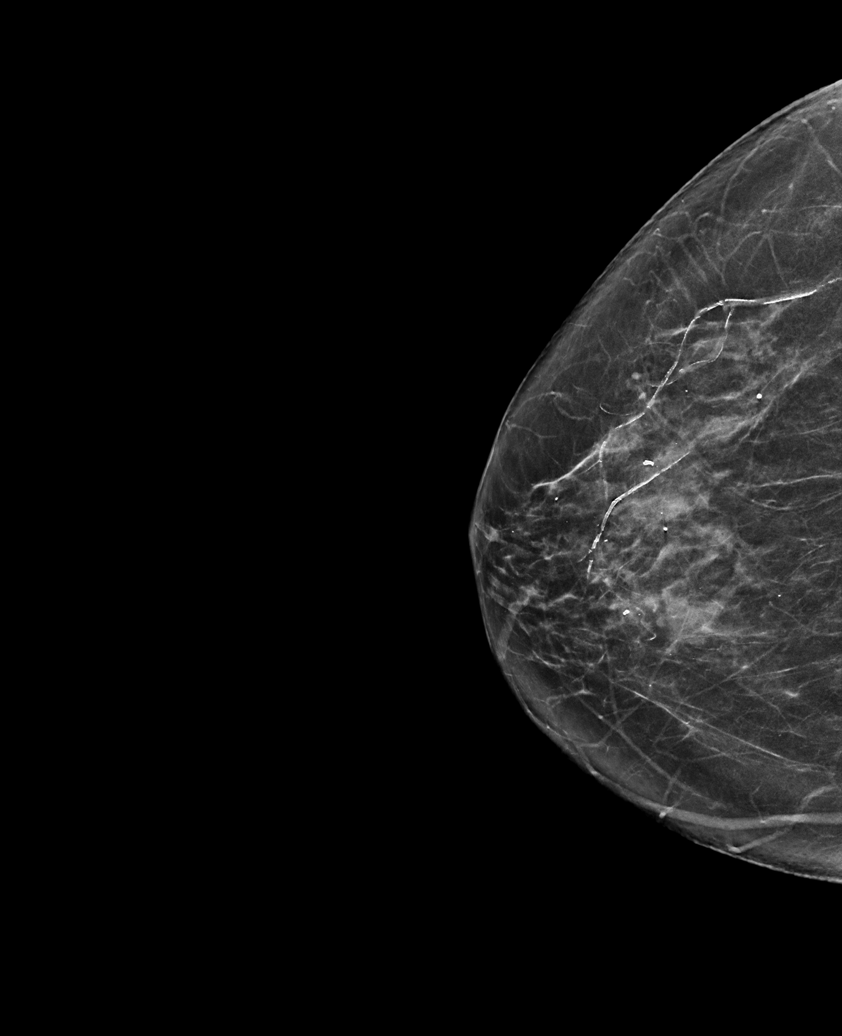

[L MLO synth-2D]
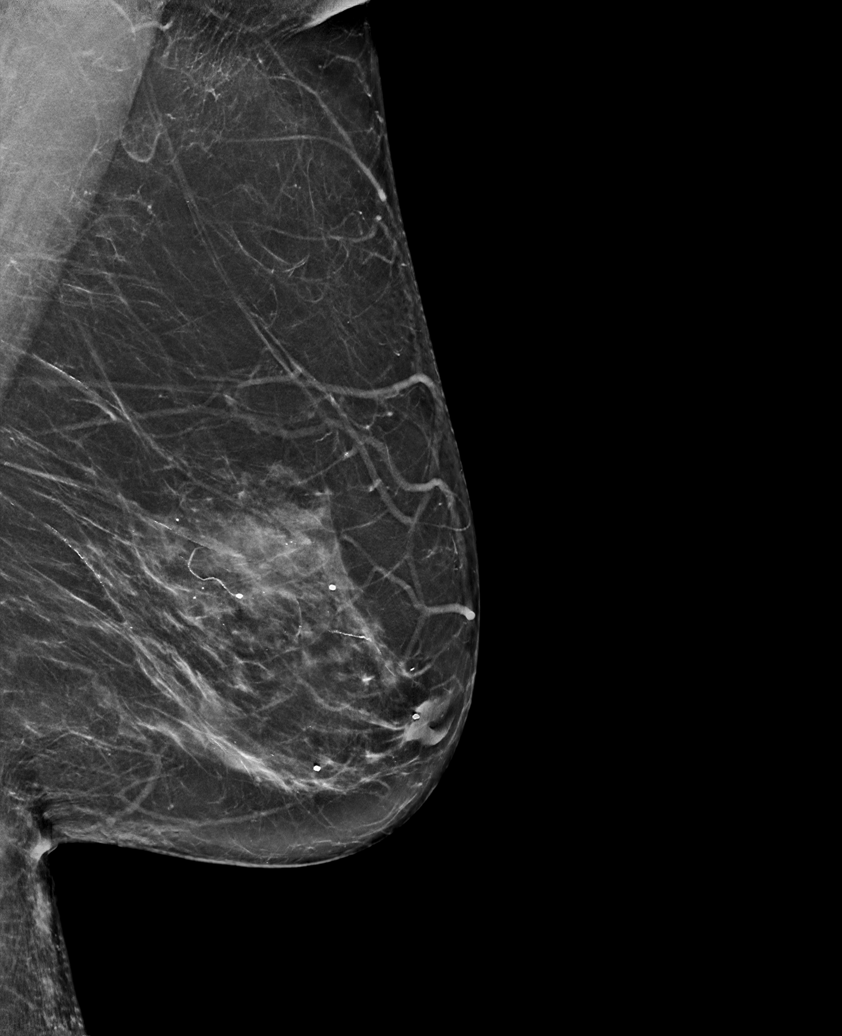

[L CC synth-2D (2 of 2)]
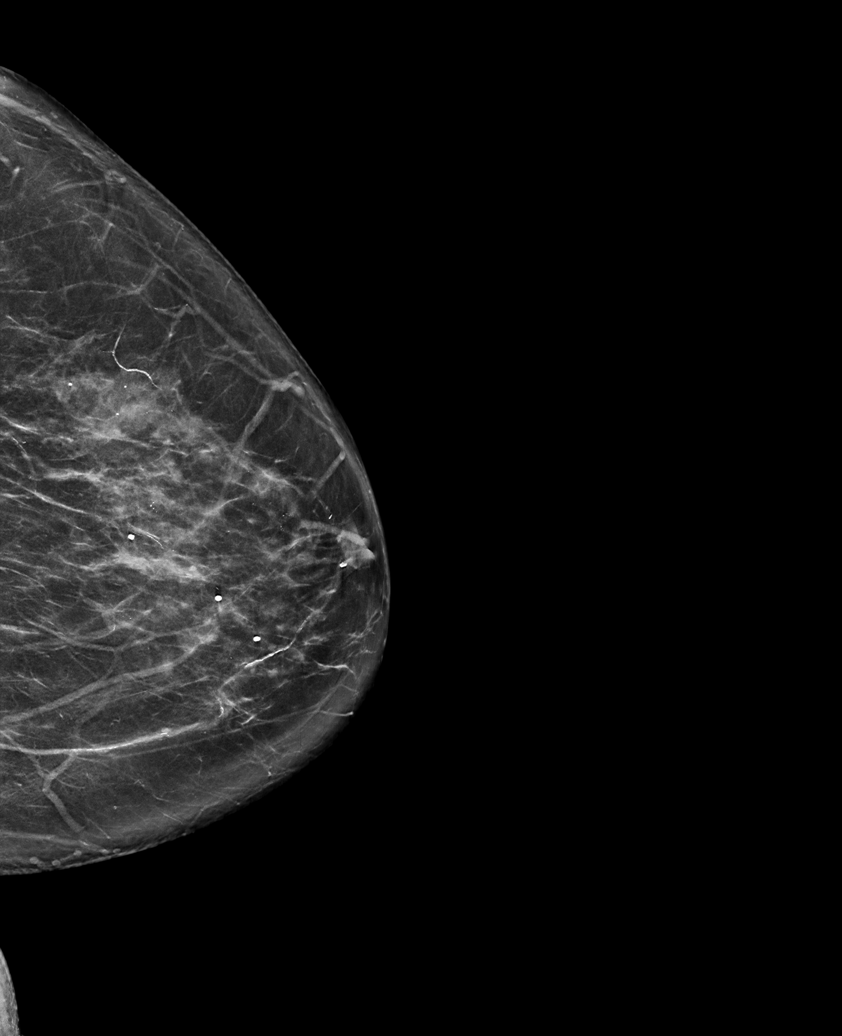

[L CC tomo · tomo slice 35/70.0]
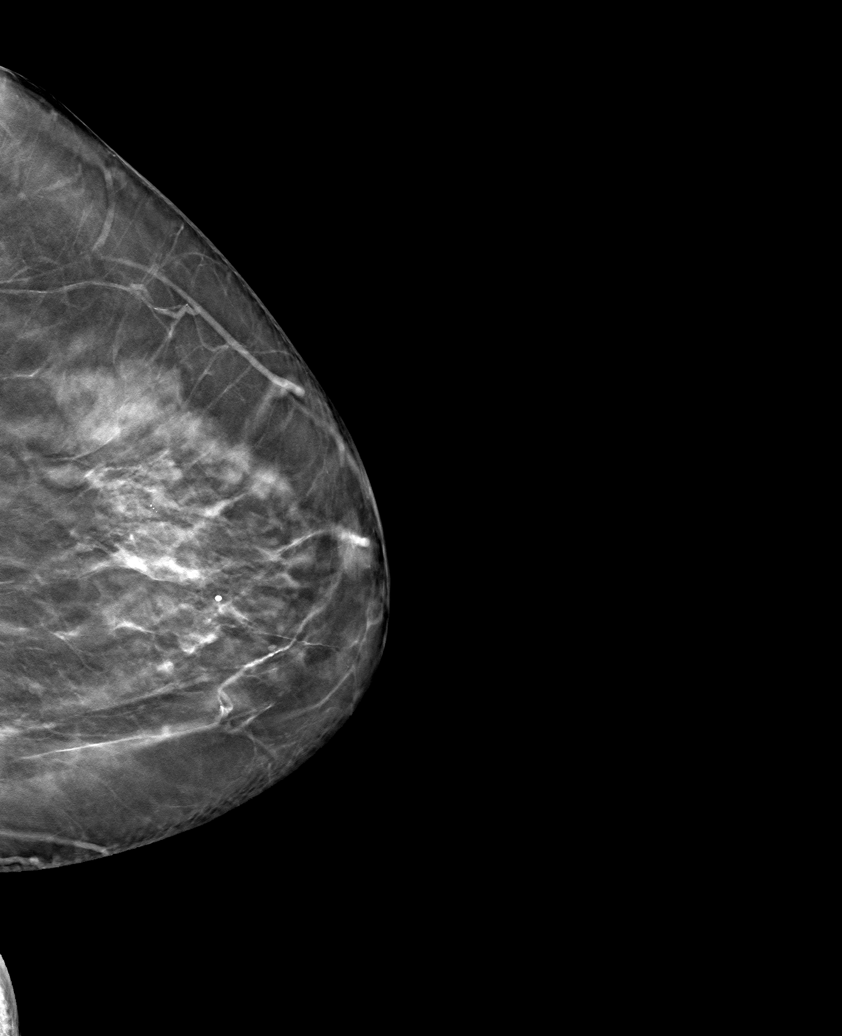

[6 of 30 positions shown; findings below may reference images not displayed]

ACR Breast Density Category c: The breast tissue is heterogeneously
dense, which may obscure small masses.
FINDINGS: There are no findings suspicious for malignancy.
IMPRESSION: No mammographic evidence of malignancy. A result letter of this
screening mammogram will be mailed directly to the patient.

RECOMMENDATION:
Screening mammogram in one year. (Code:Q3-W-BC3)

BI-RADS CATEGORY  1: Negative.

## 2023-04-17 DIAGNOSIS — Z299 Encounter for prophylactic measures, unspecified: Secondary | ICD-10-CM | POA: Diagnosis not present

## 2023-04-17 DIAGNOSIS — E7800 Pure hypercholesterolemia, unspecified: Secondary | ICD-10-CM | POA: Diagnosis not present

## 2023-04-17 DIAGNOSIS — Z0000 Encounter for general adult medical examination without abnormal findings: Secondary | ICD-10-CM | POA: Diagnosis not present

## 2023-04-17 DIAGNOSIS — I10 Essential (primary) hypertension: Secondary | ICD-10-CM | POA: Diagnosis not present

## 2023-04-17 DIAGNOSIS — Z79899 Other long term (current) drug therapy: Secondary | ICD-10-CM | POA: Diagnosis not present

## 2023-04-17 DIAGNOSIS — R5383 Other fatigue: Secondary | ICD-10-CM | POA: Diagnosis not present

## 2023-04-18 DIAGNOSIS — E7800 Pure hypercholesterolemia, unspecified: Secondary | ICD-10-CM | POA: Diagnosis not present

## 2023-04-18 DIAGNOSIS — R5383 Other fatigue: Secondary | ICD-10-CM | POA: Diagnosis not present

## 2023-04-18 DIAGNOSIS — E039 Hypothyroidism, unspecified: Secondary | ICD-10-CM | POA: Diagnosis not present

## 2023-04-18 DIAGNOSIS — D229 Melanocytic nevi, unspecified: Secondary | ICD-10-CM | POA: Diagnosis not present

## 2023-04-18 DIAGNOSIS — Z299 Encounter for prophylactic measures, unspecified: Secondary | ICD-10-CM | POA: Diagnosis not present

## 2023-04-18 DIAGNOSIS — D239 Other benign neoplasm of skin, unspecified: Secondary | ICD-10-CM | POA: Diagnosis not present

## 2023-04-18 DIAGNOSIS — I10 Essential (primary) hypertension: Secondary | ICD-10-CM | POA: Diagnosis not present

## 2023-04-18 DIAGNOSIS — D225 Melanocytic nevi of trunk: Secondary | ICD-10-CM | POA: Diagnosis not present

## 2023-05-25 DIAGNOSIS — H524 Presbyopia: Secondary | ICD-10-CM | POA: Diagnosis not present

## 2023-05-25 DIAGNOSIS — Z961 Presence of intraocular lens: Secondary | ICD-10-CM | POA: Diagnosis not present

## 2023-09-29 DIAGNOSIS — Z7189 Other specified counseling: Secondary | ICD-10-CM | POA: Diagnosis not present

## 2023-09-29 DIAGNOSIS — Z1339 Encounter for screening examination for other mental health and behavioral disorders: Secondary | ICD-10-CM | POA: Diagnosis not present

## 2023-09-29 DIAGNOSIS — Z1331 Encounter for screening for depression: Secondary | ICD-10-CM | POA: Diagnosis not present

## 2023-09-29 DIAGNOSIS — Z299 Encounter for prophylactic measures, unspecified: Secondary | ICD-10-CM | POA: Diagnosis not present

## 2023-09-29 DIAGNOSIS — Z0000 Encounter for general adult medical examination without abnormal findings: Secondary | ICD-10-CM | POA: Diagnosis not present

## 2023-09-29 DIAGNOSIS — G629 Polyneuropathy, unspecified: Secondary | ICD-10-CM | POA: Diagnosis not present

## 2023-09-29 DIAGNOSIS — Z79899 Other long term (current) drug therapy: Secondary | ICD-10-CM | POA: Diagnosis not present

## 2023-09-29 DIAGNOSIS — E7800 Pure hypercholesterolemia, unspecified: Secondary | ICD-10-CM | POA: Diagnosis not present

## 2023-09-29 DIAGNOSIS — R5383 Other fatigue: Secondary | ICD-10-CM | POA: Diagnosis not present

## 2023-09-29 DIAGNOSIS — I10 Essential (primary) hypertension: Secondary | ICD-10-CM | POA: Diagnosis not present

## 2023-12-18 DIAGNOSIS — E2839 Other primary ovarian failure: Secondary | ICD-10-CM | POA: Diagnosis not present

## 2024-03-28 DIAGNOSIS — K219 Gastro-esophageal reflux disease without esophagitis: Secondary | ICD-10-CM | POA: Diagnosis not present

## 2024-03-28 DIAGNOSIS — I10 Essential (primary) hypertension: Secondary | ICD-10-CM | POA: Diagnosis not present

## 2024-03-28 DIAGNOSIS — E039 Hypothyroidism, unspecified: Secondary | ICD-10-CM | POA: Diagnosis not present

## 2024-03-28 DIAGNOSIS — Z299 Encounter for prophylactic measures, unspecified: Secondary | ICD-10-CM | POA: Diagnosis not present

## 2024-06-05 DIAGNOSIS — E039 Hypothyroidism, unspecified: Secondary | ICD-10-CM | POA: Diagnosis not present

## 2024-06-05 DIAGNOSIS — R42 Dizziness and giddiness: Secondary | ICD-10-CM | POA: Diagnosis not present

## 2024-06-05 DIAGNOSIS — Z13220 Encounter for screening for lipoid disorders: Secondary | ICD-10-CM | POA: Diagnosis not present

## 2024-06-05 DIAGNOSIS — Z7689 Persons encountering health services in other specified circumstances: Secondary | ICD-10-CM | POA: Diagnosis not present

## 2024-06-05 DIAGNOSIS — M1990 Unspecified osteoarthritis, unspecified site: Secondary | ICD-10-CM | POA: Diagnosis not present

## 2024-06-05 DIAGNOSIS — I10 Essential (primary) hypertension: Secondary | ICD-10-CM | POA: Diagnosis not present

## 2024-06-05 DIAGNOSIS — K219 Gastro-esophageal reflux disease without esophagitis: Secondary | ICD-10-CM | POA: Diagnosis not present

## 2024-06-05 DIAGNOSIS — N1831 Chronic kidney disease, stage 3a: Secondary | ICD-10-CM | POA: Diagnosis not present

## 2024-06-05 DIAGNOSIS — R5383 Other fatigue: Secondary | ICD-10-CM | POA: Diagnosis not present

## 2024-06-13 DIAGNOSIS — Z1231 Encounter for screening mammogram for malignant neoplasm of breast: Secondary | ICD-10-CM | POA: Diagnosis not present

## 2024-06-21 DIAGNOSIS — Z9189 Other specified personal risk factors, not elsewhere classified: Secondary | ICD-10-CM | POA: Diagnosis not present

## 2024-06-21 DIAGNOSIS — W57XXXA Bitten or stung by nonvenomous insect and other nonvenomous arthropods, initial encounter: Secondary | ICD-10-CM | POA: Diagnosis not present

## 2024-06-26 DIAGNOSIS — W57XXXA Bitten or stung by nonvenomous insect and other nonvenomous arthropods, initial encounter: Secondary | ICD-10-CM | POA: Diagnosis not present

## 2024-06-26 DIAGNOSIS — N1832 Chronic kidney disease, stage 3b: Secondary | ICD-10-CM | POA: Diagnosis not present

## 2024-06-26 DIAGNOSIS — E8809 Other disorders of plasma-protein metabolism, not elsewhere classified: Secondary | ICD-10-CM | POA: Diagnosis not present

## 2024-08-19 DIAGNOSIS — I10 Essential (primary) hypertension: Secondary | ICD-10-CM | POA: Diagnosis not present

## 2024-08-19 DIAGNOSIS — Z0000 Encounter for general adult medical examination without abnormal findings: Secondary | ICD-10-CM | POA: Diagnosis not present

## 2024-08-19 DIAGNOSIS — E039 Hypothyroidism, unspecified: Secondary | ICD-10-CM | POA: Diagnosis not present

## 2024-08-19 DIAGNOSIS — Z7189 Other specified counseling: Secondary | ICD-10-CM | POA: Diagnosis not present

## 2024-08-19 DIAGNOSIS — N1831 Chronic kidney disease, stage 3a: Secondary | ICD-10-CM | POA: Diagnosis not present

## 2024-08-19 DIAGNOSIS — E8809 Other disorders of plasma-protein metabolism, not elsewhere classified: Secondary | ICD-10-CM | POA: Diagnosis not present
